# Patient Record
Sex: Male | Born: 1980 | Race: White | Hispanic: No | Marital: Single | Smoking: Former smoker
Health system: Southern US, Community
[De-identification: ages and names within clinical notes are randomized; demographics above are authoritative.]

## PROBLEM LIST (undated history)

## (undated) DIAGNOSIS — F32A Depression, unspecified: Secondary | ICD-10-CM

## (undated) DIAGNOSIS — Z8619 Personal history of other infectious and parasitic diseases: Secondary | ICD-10-CM

## (undated) HISTORY — DX: Personal history of other infectious and parasitic diseases: Z86.19

## (undated) HISTORY — DX: Depression, unspecified: F32.A

---

## 2013-08-01 HISTORY — PX: OTHER SURGICAL HISTORY: SHX169

## 2020-04-21 ENCOUNTER — Telehealth: Payer: Self-pay | Admitting: *Deleted

## 2020-04-21 ENCOUNTER — Encounter: Payer: Self-pay | Admitting: *Deleted

## 2020-04-21 NOTE — Telephone Encounter (Signed)
Noted if new symptoms consistent with covid later this week would recommend testing but if resolved no further action required at this time.

## 2020-04-21 NOTE — Telephone Encounter (Signed)
HR asked RN to contact pt for Covid r/o as he called out of work today and yesterday citing stomach issues.  Pt reports Hx of weight loss surgery 6 years ago. Intermittently he can still have sx of dumping syndrome when he eats something outside of his normal diet. He believes sx are r/t dairy (ice cream) that he had on Sunday. Sx improving today and he plans to RTW tomorrow. Denies any other sx such as body aches, fever/chills, URI sx. HR made aware that sx are not concerning for Covid; personal medical related only.

## 2020-06-10 ENCOUNTER — Telehealth: Payer: Self-pay | Admitting: Registered Nurse

## 2020-06-10 ENCOUNTER — Encounter: Payer: Self-pay | Admitting: Registered Nurse

## 2020-06-10 NOTE — Telephone Encounter (Signed)
HR asked clinic staff to contact pt for Covid r/o as he called out of work today and yesterday citing stomach issues.  Pt reports Hx of bariatric surgery-gastric sleeve 2015 in [place], [place]. Intermittently he can still have sx of dumping syndrome when he eats something outside of his normal diet. He believes sx are r/t dairy that he had a couple days ago.  He has been eating oatmeal and restricted bland diet and symptoms have been improving plans to RTW tomorrow.  HR notified no covid testing recommended and patient cleared to RTW 06/11/2020.  Sx improving today and he plans to RTW tomorrow. Denies any other sx such as body aches, fever/chills, URI sx. HR made aware that sx are not concerning for Covid; personal medical related only.   Patient instructed to notify clinic staff if new symptoms develop for re-evaluation.  Discussed with patient [employer] can not write work excuse/FMLA paperwork and he would need to contact his PCM.  Patient verbalized understanding information/instructions, agreed with plan of care and had no further questions at this time.

## 2020-06-11 NOTE — Telephone Encounter (Signed)
noted 

## 2020-06-11 NOTE — Telephone Encounter (Signed)
Noted. Personal medical. Not Covid related, no quarantine indicated. Pt plans to RTW tomorrow. Of note, RN spoke with pt about a month ago after he had similar sx. At that time, RN discussed intermittent FMLA and provided a list of local primary care offices so pt could work on establishing care with primary.

## 2020-06-13 NOTE — Telephone Encounter (Signed)
Please verify patient returned to work as expected 

## 2020-06-15 NOTE — Telephone Encounter (Signed)
Noted RTW as expected. 

## 2020-06-15 NOTE — Telephone Encounter (Signed)
Per pt's supervisor, pt RTW 06/11/20 as expected.

## 2020-06-16 ENCOUNTER — Telehealth: Payer: Self-pay | Admitting: Registered Nurse

## 2020-06-16 ENCOUNTER — Encounter: Payer: Self-pay | Admitting: Registered Nurse

## 2020-06-16 NOTE — Telephone Encounter (Signed)
Patient asking if xanax can be refilled in clinic as ran out a few weeks ago and trying to establish care with new PCM appt scheduled December.  Discussed with patient contract limitations prohibit me from prescribing mental health medications/controlled medications in this clinic.  Doreesa does counseling telehealth and RN Rolly Salter will send him her contact information via email.  Discussed [facility] now and was given central appts telephone number.  Patient also stated he was told of resident clinic at [facility] that might be able to see him sooner than PCM and he is going to call that number also.  Patient in no acute distress well groomed normal speech/behavior respirations even and unlabored 16 gait sure and steady in clinic reported new job going well and he is enjoying working at [employer].  Patient verbalized understanding of information/instructions agreed with plan of care and had no further questions at this time.

## 2020-06-17 ENCOUNTER — Encounter: Payer: Self-pay | Admitting: Internal Medicine

## 2020-06-22 NOTE — Telephone Encounter (Signed)
Noted patient was given EAP provider information and had community clinic information to schedule appts.

## 2020-06-22 NOTE — Telephone Encounter (Signed)
RN confirmed pt's personal email address over phone with pt and sent counselor Dorisa's contact information.

## 2020-07-13 ENCOUNTER — Telehealth: Payer: Self-pay | Admitting: *Deleted

## 2020-07-13 ENCOUNTER — Encounter: Payer: Self-pay | Admitting: *Deleted

## 2020-07-13 NOTE — Telephone Encounter (Signed)
Pt received booster on late Thursday 07/09/20. Called out of work on 12/10 due to fatigue and body aches. Sx have now continued through today but also developed a cough on Saturday. Reports chills, has not checked temp. Due to more than 72 hours of sx after booster, and cough which is not typical booster side effect, scheduled pt for testing.  Day 1 of Sx: 07/10/20 Day 1 of quarantine: 07/10/20 (woke up with sx 0400) Testing between sx days 3-5 Test scheduled 07/13/20 at 1500 at [facility] Day 7 quarantine: 07/16/20 RTW date: 07/17/20  NP virtual appt made for tomorrow 12/14.HR made aware of dates above.

## 2020-07-13 NOTE — Telephone Encounter (Signed)
Noted agree with quarantine and testing plan

## 2020-07-14 NOTE — Telephone Encounter (Signed)
Reviewed RN Rolly Salter note covid test results pending symptoms improving continue quarantine

## 2020-07-14 NOTE — Telephone Encounter (Signed)
Spoke with pt by phone. He reports feeling better today. Fatigue and body aches have resolved. Reports infrequent cough still, none noted during 7 minute call. Covid test completed yesterday. Continuing quarantine. Will f/u tomorrow or 12/16 for anticipated results.

## 2020-07-15 NOTE — Telephone Encounter (Signed)
No answer left message to call clinic when test results received or email me at [email or web address]

## 2020-07-16 NOTE — Telephone Encounter (Signed)
Patient left message covid test results negative sent screen shot.  RTW date 07/17/2020 as long as symptoms improving, no new symptoms and no vomiting/diarrhea/fever or chills on 07/16/2020 message replied and sent to HR.

## 2020-07-17 NOTE — Telephone Encounter (Signed)
Confirmed with pt that he did RTW today and denies current sx. No further needs. Closing encounter.

## 2020-07-18 NOTE — Telephone Encounter (Signed)
Noted patient RTW as expected no further questions or concerns.

## 2020-07-29 ENCOUNTER — Telehealth: Payer: Self-pay | Admitting: Registered Nurse

## 2020-07-29 ENCOUNTER — Encounter: Payer: Self-pay | Admitting: Registered Nurse

## 2020-07-29 NOTE — Telephone Encounter (Signed)
HR manager Lincoln Maxin notified NP that pt reported exposed on 07/25/2020 to a confirmed positive covid person. Asymptomatic.  Covid vaccinated per HR.  Attempted to reach patient via telephone no answer left message.  Discussed testing needs to be scheduled day 7 after exposure, monitor for symptoms x 14 days wear mask at work and not able to eat in group lunch room but in car, outside or another location away from others recommended e.g. home.  Patient can email me [email or web address] when clinic closed (Wed, Sat, Sun) or call RN Rolly Salter [phone] M, Tu, Th, Fr during clinic hours to discuss further.  Need vaccination dates and if greater than 6 months since last dose/no booster recommend scheduling booster.

## 2020-07-30 NOTE — Telephone Encounter (Addendum)
Spoke with pt by phone. He confirms all information recorded by NP as correct. He reports remaining asymptomatic currently. Discussed eating in car or outside and he reports he already eats lunch in his car. Reviewed mask use for next 14 days.  Testing set up for Monday 08/04/19 at 1430 at [facility]. Immunization and booster dates entered into [facility].

## 2020-07-30 NOTE — Telephone Encounter (Signed)
Reviewed RN Haley note agree with plan of care. 

## 2020-08-06 NOTE — Telephone Encounter (Signed)
Patient reported covid test results negative continue to monitor for covid symptoms and wearing mask.  Strict mask use ends today and may use company lunch room starting tomorrow 1/7 as long no new symptoms or known covid exposures.  Patient fully vaccinated and boosted. Patient notified CDC updated covid guidelines twice in the past 10 days. Patient notified covid and flu cases on the rise in local community wear mask when around others and socially distance as covid test rate positives near 30% this week.

## 2020-08-08 NOTE — Telephone Encounter (Signed)
Patient returned call doing well, no problems at work and no symptoms at this time or concerns.  tcon closed.  A&Ox3 respirations even and unlabored spoke full sentences without difficulty no cough/nasal congestion/sniffing or throat clearing noted.

## 2020-08-08 NOTE — Telephone Encounter (Signed)
Left message checking in to ensure remained asymptomatic and no difficulty with working to close tcon as monitoring period has ended.

## 2020-08-24 ENCOUNTER — Ambulatory Visit: Payer: Self-pay | Admitting: Family Medicine

## 2020-08-25 ENCOUNTER — Ambulatory Visit: Payer: Self-pay | Admitting: Family Medicine

## 2020-09-01 ENCOUNTER — Telehealth: Payer: Self-pay | Admitting: *Deleted

## 2020-09-01 NOTE — Telephone Encounter (Signed)
RN notified by HR that pt left work early today reporting body aches and fatigue. Spoke with pt by phone. He reports HA, exhaustion, rhinorrhea.   Day 0 09/01/20 Day 5 09/06/20 RTW 09/07/20 (next scheduled workday 2/8) with strict mask use thru Friday 2/11.  Testing set up for Covid and flu testing at [facility] 09/03/20 at 1010. 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 Reviewed Dayquil/Nyquil, nasal saline spray and hydration for home management of sx. Pt denies further questions/concerns.

## 2020-09-03 ENCOUNTER — Encounter: Payer: Self-pay | Admitting: *Deleted

## 2020-09-03 ENCOUNTER — Ambulatory Visit: Payer: Self-pay | Admitting: Family Medicine

## 2020-09-03 NOTE — Telephone Encounter (Signed)
Reviewed RN Haley note agreed with plan of care. 

## 2020-09-04 NOTE — Telephone Encounter (Signed)
Attempted to contact pt for f/u. No answer. LVMRCB.

## 2020-09-05 NOTE — Telephone Encounter (Signed)
Patient contacted via telephone reported flu and covid tests both negative and that he was surprised.  Feeling better and tired of being in quarantine/bed.  Tolerating po intake without difficulty and reported normal urination and stooling daily now.  Denied fever/chills/difficulty breathing.  Discussed may return to work next scheduled shift 2/8 as long as no vomiting/diarrhea/fever/chills 24 hours prior to shift.  Patient spoke full sentences without difficulty; no nasal congestion/throat clearing noted.  Respirations even and unlabored.  Discussed disinfecting high touch surfaces daily and that other viruses are circulating in community e.g. rhinovirus/metapneumovirus, atypical flu.  Reinforced strict mask wear through 09/11/2020  Patient verbalized understanding information/instructions, agreed with plan of care and had no further questions at this time.  HR notified cleared to RTW 09/08/20.

## 2020-09-07 NOTE — Telephone Encounter (Signed)
Spoke with pt by phone. They are still ready to RTW tomorrow 2/8 as planned. Denies any sx or concerns. Strict mask use thru 2/11.

## 2020-09-08 NOTE — Telephone Encounter (Signed)
Reviewed RN Rolly Salter note patient plans to return onsite today 09/08/20

## 2020-09-08 NOTE — Telephone Encounter (Signed)
Spoke with pt by phone. Confirmed they did RTW today as planned. Strict mask use thru 2/11. Closing encounter.

## 2020-09-09 NOTE — Telephone Encounter (Signed)
Noted patient returned to work; reviewed [employer] note.

## 2020-09-16 ENCOUNTER — Other Ambulatory Visit: Payer: Self-pay

## 2020-09-17 ENCOUNTER — Ambulatory Visit: Payer: No Typology Code available for payment source | Admitting: Family Medicine

## 2020-09-17 ENCOUNTER — Encounter: Payer: Self-pay | Admitting: Family Medicine

## 2020-09-17 ENCOUNTER — Other Ambulatory Visit: Payer: Self-pay

## 2020-09-17 VITALS — BP 120/80 | HR 64 | Temp 98.2°F | Ht 69.0 in | Wt 269.0 lb

## 2020-09-17 DIAGNOSIS — F410 Panic disorder [episodic paroxysmal anxiety] without agoraphobia: Secondary | ICD-10-CM

## 2020-09-17 DIAGNOSIS — F321 Major depressive disorder, single episode, moderate: Secondary | ICD-10-CM | POA: Diagnosis not present

## 2020-09-17 DIAGNOSIS — G8929 Other chronic pain: Secondary | ICD-10-CM

## 2020-09-17 DIAGNOSIS — F411 Generalized anxiety disorder: Secondary | ICD-10-CM | POA: Insufficient documentation

## 2020-09-17 DIAGNOSIS — F988 Other specified behavioral and emotional disorders with onset usually occurring in childhood and adolescence: Secondary | ICD-10-CM | POA: Insufficient documentation

## 2020-09-17 DIAGNOSIS — M5441 Lumbago with sciatica, right side: Secondary | ICD-10-CM

## 2020-09-17 DIAGNOSIS — R41840 Attention and concentration deficit: Secondary | ICD-10-CM

## 2020-09-17 DIAGNOSIS — E669 Obesity, unspecified: Secondary | ICD-10-CM

## 2020-09-17 DIAGNOSIS — E66812 Obesity, class 2: Secondary | ICD-10-CM

## 2020-09-17 DIAGNOSIS — Z23 Encounter for immunization: Secondary | ICD-10-CM | POA: Diagnosis not present

## 2020-09-17 DIAGNOSIS — M5442 Lumbago with sciatica, left side: Secondary | ICD-10-CM

## 2020-09-17 DIAGNOSIS — Z113 Encounter for screening for infections with a predominantly sexual mode of transmission: Secondary | ICD-10-CM

## 2020-09-17 DIAGNOSIS — Z9884 Bariatric surgery status: Secondary | ICD-10-CM

## 2020-09-17 NOTE — Patient Instructions (Addendum)
We will set up ADD testing.  Continue current meds.  Keep working on healthy eating and regular exercise. Look into considering Cymbalta as treatment for anxiety as well as chronic back pain.

## 2020-09-17 NOTE — Progress Notes (Signed)
Patient ID: Coran Dipaola, male    DOB: 16-Oct-1980, 40 y.o.   MRN: [id number]  This visit was conducted in person.  BP 120/80   Pulse 64   Temp 98.2 F (36.8 C) (Temporal)   Ht 5\' 9"  (1.753 m)   Wt 269 lb (122 kg)   SpO2 98%   BMI 39.72 kg/m    CC:  Chief Complaint  Patient presents with  . Establish Care    Subjective:   HPI: Jaxan Michel is a 40 y.o. male presenting on 09/17/2020 for Establish Care  He just moved here from 09/19/2020 last 01/2020.   MDD, moderate.. Seeing psychiatrist and counselor.  Was on prozac... wean off  last year.   Mood significantly improved with  Then moved, S/P divorce.   Situation anxiety ( worse if out in public), panic attacks:  Using alprazolam 1/2 tab 2 times daily. Previous psychiatrist recommended him getting an ADD screen.   BMI 39 S/P gastric sleeve  Working on health eating, very active job at 03/2020.  PDMP reviewed during this encounter.      Relevant past medical, surgical, family and social history reviewed and updated as indicated. Interim medical history since our last visit reviewed. Allergies and medications reviewed and updated. Outpatient Medications Prior to Visit  Medication Sig Dispense Refill  . ALPRAZolam (XANAX) 0.5 MG tablet Take 0.5 mg by mouth 2 (two) times daily as needed.    . baclofen (LIORESAL) 20 MG tablet Take 20 mg by mouth every 6 (six) hours as needed.    [employer] tiZANidine (ZANAFLEX) 2 MG tablet Take 2-4 mg by mouth at bedtime.    . traZODone (DESYREL) 50 MG tablet TAKE 2 TABLETS EVERY NIGHT AS NEEDED FOR INSOMNIA     No facility-administered medications prior to visit.     Per HPI unless specifically indicated in ROS section below Review of Systems  Constitutional: Negative for fatigue and fever.  HENT: Negative for ear pain.   Eyes: Negative for pain.  Respiratory: Negative for cough and shortness of breath.   Cardiovascular: Negative for chest pain, palpitations and leg swelling.   Gastrointestinal: Negative for abdominal pain.  Genitourinary: Negative for dysuria.  Musculoskeletal: Negative for arthralgias.  Neurological: Negative for syncope, light-headedness and headaches.  Psychiatric/Behavioral: Negative for dysphoric mood.   Objective:  BP 120/80   Pulse 64   Temp 98.2 F (36.8 C) (Temporal)   Ht 5\' 9"  (1.753 m)   Wt 269 lb (122 kg)   SpO2 98%   BMI 39.72 kg/m   Wt Readings from Last 3 Encounters:  09/17/20 269 lb (122 kg)      Physical Exam Constitutional:      General: He is not in acute distress.    Appearance: Normal appearance. He is well-developed. He is not ill-appearing or toxic-appearing.  HENT:     Head: Normocephalic and atraumatic.     Right Ear: Hearing, tympanic membrane, ear canal and external ear normal.     Left Ear: Hearing, tympanic membrane, ear canal and external ear normal.     Nose: Nose normal.     Mouth/Throat:     Pharynx: Uvula midline.  Eyes:     General: Lids are normal. Lids are everted, no foreign bodies appreciated.     Conjunctiva/sclera: Conjunctivae normal.     Pupils: Pupils are equal, round, and reactive to light.  Neck:     Thyroid: No thyroid mass or thyromegaly.     Vascular:  No carotid bruit.     Trachea: Trachea and phonation normal.  Cardiovascular:     Rate and Rhythm: Normal rate and regular rhythm.     Pulses: Normal pulses.     Heart sounds: S1 normal and S2 normal. No murmur heard. No gallop.   Pulmonary:     Breath sounds: Normal breath sounds. No wheezing, rhonchi or rales.  Abdominal:     General: Bowel sounds are normal.     Palpations: Abdomen is soft.     Tenderness: There is no abdominal tenderness. There is no guarding or rebound.     Hernia: No hernia is present.  Musculoskeletal:     Cervical back: Normal range of motion and neck supple.  Lymphadenopathy:     Cervical: No cervical adenopathy.  Skin:    General: Skin is warm and dry.     Findings: No rash.  Neurological:      Mental Status: He is alert.     Cranial Nerves: No cranial nerve deficit.     Sensory: No sensory deficit.     Gait: Gait normal.     Deep Tendon Reflexes: Reflexes are normal and symmetric.  Psychiatric:        Speech: Speech normal.        Behavior: Behavior normal.        Judgment: Judgment normal.       No results found for this or any previous visit.  This visit occurred during the SARS-CoV-2 public health emergency.  Safety protocols were in place, including screening questions prior to the visit, additional usage of staff PPE, and extensive cleaning of exam room while observing appropriate contact time as indicated for disinfecting solutions.   COVID 19 screen:  No recent travel or known exposure to COVID19 The patient denies respiratory symptoms of COVID 19 at this time. The importance of social distancing was discussed today.   Assessment and Plan    Problem List Items Addressed This Visit    Attention and concentration deficit    Referred for  ADD evaluation.      Chronic bilateral low back pain with bilateral sciatica     Stable control on tizanidine  Or baclofen.  Do home PT.      Relevant Medications   traZODone (DESYREL) 50 MG tablet   MDD (major depressive disorder), single episode, moderate (HCC)    Previously seeing psychiatrist and counselor.  Look into considering Cymbalta as treatment for anxiety as well as chronic back pain.      Relevant Medications   traZODone (DESYREL) 50 MG tablet   Obesity, Class II, BMI 35-39.9, no comorbidity    Encouraged exercise, weight loss, healthy eating habits.       Relevant Orders   Lipid panel   Comprehensive metabolic panel   Panic attacks    Using alprazolam 1/2 tab 2 times daily.      Relevant Medications   traZODone (DESYREL) 50 MG tablet    Other Visit Diagnoses    Need for Tdap vaccination    -  Primary   Relevant Orders   Tdap vaccine greater than or equal to 7yo IM (Completed)   Screen  for STD (sexually transmitted disease)       Relevant Orders   Hepatitis panel, acute   HIV Antibody (routine testing w rflx)   C. trachomatis/N. gonorrhoeae RNA   RPR   History of gastric restrictive surgery       Relevant Orders   Vitamin  B12   VITAMIN D 25 Hydroxy (Vit-D Deficiency, Fractures)   CBC with Differential/Platelet   IBC + Ferritin       Kerby Nora, MD

## 2020-09-17 NOTE — Progress Notes (Signed)
[id number]  

## 2020-09-17 NOTE — Assessment & Plan Note (Addendum)
Stable control on tizanidine  Or baclofen.  Do home PT.

## 2020-10-22 ENCOUNTER — Telehealth: Payer: Self-pay | Admitting: *Deleted

## 2020-10-22 ENCOUNTER — Encounter: Payer: Self-pay | Admitting: *Deleted

## 2020-10-22 MED ORDER — ONDANSETRON 4 MG PO TBDP: 4.0000 mg | ORAL_TABLET | Freq: Three times a day (TID) | ORAL | 0 refills | Status: DC | PRN

## 2020-10-22 NOTE — Telephone Encounter (Signed)
Reviewed RN Rolly Salter note and awaiting patient call back to clinic staff for evaluation.

## 2020-10-22 NOTE — Telephone Encounter (Signed)
Reviewed RN Rolly Salter note agree with plan of care and electronic Rx for zofran signed and sent to patient pharmacy of choice.  Will follow up with patient tomorrow for re-evaluation.

## 2020-10-22 NOTE — Telephone Encounter (Signed)
Pt returned call. He reports nausea and fatigue have worsened. Able to tolerate small sips of fluid, but no solids. HA persists. Nasal congestion, runny nose, PND with throat clearing. Denies cough or chest congestion.  Booster received 07/09/20.   Rx sent to pharmacy of choice for Zofran for nausea.   Day 0 3/23 Day 5 3/28 RTW 3/29 with strict mask use thru 4/2.  Flu and Covid testing scheduled for Fri 3/25 at 5:00 at [facility].

## 2020-10-22 NOTE — Telephone Encounter (Signed)
Pt sent email to [employer] yesterday 3/23 reporting being at work for about an hour and a half before developing n/v. He left and went home and rested. Later in the day he developed body aches, runny nose, and a severe headache. As of his email at 2318 last night, he was tolerating liquids po but not solids. Lives with a roommate and already notified roommate of sx and is wearing a mask when he must be out in the common areas and is otherwise isolating in his own room.   Attempted to contact pt. No answer. LVMRCB.

## 2020-10-26 NOTE — Telephone Encounter (Signed)
Spoke with pt by phone. He reports nausea resolved Friday night after starting Zofran. Only 3 doses needed. All other sx resolved Saturday. Back to regular diet today. Both flu and Covid tests back as negative yesterday. Advised cleared to RTW tomorrow 3/29 as planned with strict mask use thru 4/2. Denies questions or concerns.

## 2020-10-26 NOTE — Telephone Encounter (Signed)
Reviewed RN Rolly Salter noted negative test results agreed with plan of care for RTW 3/29.

## 2020-11-01 NOTE — Telephone Encounter (Signed)
Patient contacted via telephone stated feeling well but is not planning to return to work onsite as last day of work 27 Oct 2020.  No longer eligible for care at [facility] clinic.  Notified patient I would tell RN Rolly Salter he is no long employee of [employer] also.  Patient verbalized understanding information/instructions, agreed with plan of care and had no further questions at this time.

## 2020-11-03 ENCOUNTER — Telehealth: Payer: Self-pay

## 2020-11-03 MED ORDER — DULOXETINE HCL 30 MG PO CPEP
30.0000 mg | ORAL_CAPSULE | Freq: Every day | ORAL | 3 refills | Status: DC
Start: 2020-11-03 — End: 2020-11-27

## 2020-11-03 NOTE — Telephone Encounter (Signed)
Patient called and stated that he saw Dr. Ermalene Searing in February and she suggested that he switch to Cymbalta to help with anxiety and back pain. Patient stated that he is ready to start Cymbalta. Patient stated that he recently lost his job, so he does not currently have insurance, but is willing to pay OOP for the prescription. [facility] [place] Patient stated that he cannot be seen or have any other test until he gets health insurance.

## 2020-11-03 NOTE — Addendum Note (Signed)
Addended by: Kerby Nora E on: 11/03/2020 05:41 PM   Modules accepted: Orders

## 2020-11-03 NOTE — Telephone Encounter (Signed)
If he does not view MyChart message sent... Call Sent in Rx for Cymbalta 30 mg daily, takes 3-4 weeks to improve... Have him send me a MyChart message with update in 2 weeks.. if no side effects at that time we can plan to increase dose to 60 mg daily.

## 2020-11-04 NOTE — Telephone Encounter (Signed)
Randall Barnett notified as instructed by telephone.  Patient states understanding.  (I couldn't find a MyChart message).

## 2020-11-25 ENCOUNTER — Telehealth: Payer: Self-pay | Admitting: *Deleted

## 2020-11-25 NOTE — Assessment & Plan Note (Addendum)
Previously seeing psychiatrist and counselor.  Look into considering Cymbalta as treatment for anxiety as well as chronic back pain.

## 2020-11-25 NOTE — Telephone Encounter (Signed)
-----   Message from Excell Seltzer, MD sent at 11/25/2020 10:06 AM EDT -----  Call this pt for an update on mood after starting Cymbalta and also if he still wants a referral to ADD ( this was discussed but I do not see that it was ordered)

## 2020-11-25 NOTE — Assessment & Plan Note (Signed)
Using alprazolam 1/2 tab 2 times daily.

## 2020-11-25 NOTE — Assessment & Plan Note (Signed)
Encouraged exercise, weight loss, healthy eating habits. ? ?

## 2020-11-25 NOTE — Telephone Encounter (Signed)
Left message for Randall Barnett to return my call.

## 2020-11-25 NOTE — Progress Notes (Signed)
See Phone Note. 

## 2020-11-25 NOTE — Assessment & Plan Note (Addendum)
Referred for  ADD evaluation.

## 2020-11-26 ENCOUNTER — Other Ambulatory Visit: Payer: Self-pay | Admitting: Family Medicine

## 2020-11-26 NOTE — Telephone Encounter (Signed)
Left message for Jayshawn to return my call. 

## 2020-11-27 MED ORDER — DULOXETINE HCL 60 MG PO CPEP: 60.0000 mg | ORAL_CAPSULE | Freq: Every day | ORAL | 0 refills | Status: DC

## 2020-11-27 NOTE — Telephone Encounter (Signed)
Do not recommend alprazolam as long term med.. recommend trial of higher dose Cymbalta if he is tolerating it.  Schedule follow up in 4 weeks after increase if he is willing. Let me know and I will send in higher dose prescription.

## 2020-11-27 NOTE — Telephone Encounter (Addendum)
Ric notified as instructed by telephone. He is agreeable in increasing Cymbalta dose.  Please send new Rx to [facility] on [address]. [place] would like a 90 day supply.  Follow up appointment scheduled 12/25/2020 at 4:00 pm.

## 2020-11-27 NOTE — Telephone Encounter (Signed)
Pt called and stated that he is not working and cant do the referral and with the Cymbalta he hasnt seen any changes , hes still having aniexty. And wanted to know about getting Xanex 0.5.

## 2020-11-27 NOTE — Telephone Encounter (Signed)
Rx for cymbalta 60 mg daily sent in

## 2020-12-25 ENCOUNTER — Ambulatory Visit: Payer: No Typology Code available for payment source | Admitting: Family Medicine

## 2021-01-19 NOTE — Telephone Encounter (Signed)
Last office visit 09/17/2020 for Establish Care.  Meds are listed as historical.  Ok to refill?

## 2021-01-21 NOTE — Telephone Encounter (Signed)
He needs the refill to [facility] to [facility] [place]. And he also wanted to know if he can get the 4mg  instead of 2mg  that way he can take 1 pill instead of 2

## 2021-01-25 MED ORDER — TIZANIDINE HCL 4 MG PO TABS: 4.0000 mg | ORAL_TABLET | Freq: Every day | ORAL | 0 refills | Status: DC

## 2021-02-22 ENCOUNTER — Other Ambulatory Visit: Payer: Self-pay | Admitting: Family Medicine

## 2021-04-20 ENCOUNTER — Other Ambulatory Visit: Payer: Self-pay

## 2021-04-20 ENCOUNTER — Emergency Department
Admission: EM | Admit: 2021-04-20 | Discharge: 2021-04-21 | Disposition: A | Discharge status: Home / Self Care | Payer: No Typology Code available for payment source | Attending: Emergency Medicine | Admitting: Emergency Medicine

## 2021-04-20 ENCOUNTER — Encounter: Payer: Self-pay | Admitting: Emergency Medicine

## 2021-04-20 DIAGNOSIS — Z5321 Procedure and treatment not carried out due to patient leaving prior to being seen by health care provider: Secondary | ICD-10-CM | POA: Insufficient documentation

## 2021-04-20 DIAGNOSIS — R519 Headache, unspecified: Secondary | ICD-10-CM | POA: Diagnosis present

## 2021-04-20 MED ORDER — TIZANIDINE HCL 4 MG PO TABS: 4.0000 mg | ORAL_TABLET | Freq: Every day | ORAL | 0 refills | Status: DC

## 2021-04-20 NOTE — Telephone Encounter (Signed)
Refill request Tizanidine Last refill 01/25/21 #90 Last office visit 09/17/20 Upcoming appointment 04/23/21

## 2021-04-20 NOTE — ED Triage Notes (Signed)
C/O headache.  AAOx3. Skin warm and dry. NAD

## 2021-04-23 ENCOUNTER — Other Ambulatory Visit: Payer: Self-pay

## 2021-04-23 ENCOUNTER — Ambulatory Visit (INDEPENDENT_AMBULATORY_CARE_PROVIDER_SITE_OTHER): Payer: No Typology Code available for payment source | Admitting: Family Medicine

## 2021-04-23 ENCOUNTER — Ambulatory Visit: Payer: No Typology Code available for payment source | Admitting: Family Medicine

## 2021-04-23 VITALS — BP 140/80 | HR 55 | Temp 98.2°F | Ht 69.0 in | Wt 263.2 lb

## 2021-04-23 DIAGNOSIS — E669 Obesity, unspecified: Secondary | ICD-10-CM | POA: Diagnosis not present

## 2021-04-23 DIAGNOSIS — Z13220 Encounter for screening for lipoid disorders: Secondary | ICD-10-CM | POA: Diagnosis not present

## 2021-04-23 DIAGNOSIS — R519 Headache, unspecified: Secondary | ICD-10-CM | POA: Diagnosis not present

## 2021-04-23 DIAGNOSIS — Z23 Encounter for immunization: Secondary | ICD-10-CM | POA: Diagnosis not present

## 2021-04-23 DIAGNOSIS — Z9884 Bariatric surgery status: Secondary | ICD-10-CM | POA: Diagnosis not present

## 2021-04-23 DIAGNOSIS — Z113 Encounter for screening for infections with a predominantly sexual mode of transmission: Secondary | ICD-10-CM

## 2021-04-23 DIAGNOSIS — E66812 Obesity, class 2: Secondary | ICD-10-CM

## 2021-04-23 NOTE — Patient Instructions (Addendum)
Check blood pressure at home. Few days a week.  Goal BP < 140/90. Call if elevated. Keep headache diary if returns. Work on healthy lifestyle.   Look into Wegovy or possibly  Trulicity, or Ozempic coverage and availability.

## 2021-04-23 NOTE — Assessment & Plan Note (Signed)
Now resolved.  Possible new dx of migraine vs viral related headache vs BP related headache.  Increase water,healthy lifestyle,  avoid migraine triggers, follow BP at home.  If recurs.Marland Kitchen keep migraine diary.   Normal neurologic exam. Cleared to return to work.

## 2021-04-23 NOTE — Progress Notes (Signed)
Patient ID: Ndrew Creason, male    DOB: 1980/11/25, 40 y.o.   MRN: [id number]  This visit was conducted in person.  BP 140/80   Pulse (!) 55   Temp 98.2 F (36.8 C) (Temporal)   Ht 5\' 9"  (1.753 m)   Wt 263 lb 4 oz (119.4 kg)   SpO2 98%   BMI 38.88 kg/m    CC: Chief Complaint  Patient presents with   Follow-up    ED Visit-Migraine    Subjective:   HPI: Macarius Ruark is a 40 y.o. male presenting on 04/23/2021 for Follow-up (ED Visit-Migraine)  40 year old male with history of MDD, GAD presents for ER follow up for migraine. Reviewed ER note in detail.  Seen 04/20/21, but he left before he was seen.  BP was 143/96   He reports he was  at work last week... woke with crick in neck.. resulted in pain in neck, emesis. Photophobia.  Pain continue to worsen.  Dx at urgent care with a migraine. Given toradol, zofran.  Given eletriptan.  Did not improve... had COVID test and flu test negative.  Sent to ER for meningitis rule out.  No fever.  Left before being seen.  He has no further headache in  last 3 days.   He has no history of migraine.   No stress, no mood changes. Good po intake.  No known triggers.   Wt Readings from Last 3 Encounters:  04/23/21 263 lb 4 oz (119.4 kg)  04/20/21 268 lb 15.4 oz (122 kg)  09/17/20 269 lb (122 kg)    BP Readings from Last 3 Encounters:  04/23/21 140/80  04/20/21 (!) 143/96  09/17/20 120/80     Relevant past medical, surgical, family and social history reviewed and updated as indicated. Interim medical history since our last visit reviewed. Allergies and medications reviewed and updated. Outpatient Medications Prior to Visit  Medication Sig Dispense Refill   ALPRAZolam (XANAX) 0.5 MG tablet Take 0.5 mg by mouth 2 (two) times daily as needed.     DULoxetine (CYMBALTA) 60 MG capsule TAKE ONE CAPSULE BY MOUTH ONE TIME DAILY 90 capsule 0   ondansetron (ZOFRAN ODT) 4 MG disintegrating tablet Take 1 tablet (4 mg total) by  mouth every 8 (eight) hours as needed for nausea or vomiting. 20 tablet 0   tiZANidine (ZANAFLEX) 4 MG tablet Take 1 tablet (4 mg total) by mouth at bedtime. 90 tablet 0   traZODone (DESYREL) 50 MG tablet TAKE 2 TABLETS EVERY NIGHT AS NEEDED FOR INSOMNIA     No facility-administered medications prior to visit.     Per HPI unless specifically indicated in ROS section below Review of Systems  Constitutional:  Negative for fatigue and fever.  HENT:  Negative for ear pain.   Eyes:  Negative for pain.  Respiratory:  Negative for cough and shortness of breath.   Cardiovascular:  Negative for chest pain, palpitations and leg swelling.  Gastrointestinal:  Negative for abdominal pain.  Genitourinary:  Negative for dysuria.  Musculoskeletal:  Negative for arthralgias.  Neurological:  Negative for syncope, light-headedness and headaches.  Psychiatric/Behavioral:  Negative for dysphoric mood.   Objective:  BP 140/80   Pulse (!) 55   Temp 98.2 F (36.8 C) (Temporal)   Ht 5\' 9"  (1.753 m)   Wt 263 lb 4 oz (119.4 kg)   SpO2 98%   BMI 38.88 kg/m   Wt Readings from Last 3 Encounters:  04/23/21 263 lb 4  oz (119.4 kg)  04/20/21 268 lb 15.4 oz (122 kg)  09/17/20 269 lb (122 kg)      Physical Exam Constitutional:      Appearance: He is well-developed.  HENT:     Head: Normocephalic.     Right Ear: Hearing normal.     Left Ear: Hearing normal.     Nose: Nose normal.  Neck:     Thyroid: No thyroid mass or thyromegaly.     Vascular: No carotid bruit.     Trachea: Trachea normal.  Cardiovascular:     Rate and Rhythm: Normal rate and regular rhythm.     Pulses: Normal pulses.     Heart sounds: Heart sounds not distant. No murmur heard.   No friction rub. No gallop.     Comments: No peripheral edema Pulmonary:     Effort: Pulmonary effort is normal. No respiratory distress.     Breath sounds: Normal breath sounds.  Skin:    General: Skin is warm and dry.     Findings: No rash.   Neurological:     General: No focal deficit present.     Mental Status: He is alert and oriented to person, place, and time.     Cranial Nerves: Cranial nerves are intact.     Sensory: Sensation is intact.     Motor: Motor function is intact.     Coordination: Coordination is intact.  Psychiatric:        Speech: Speech normal.        Behavior: Behavior normal.        Thought Content: Thought content normal.      No results found for this or any previous visit.  This visit occurred during the SARS-CoV-2 public health emergency.  Safety protocols were in place, including screening questions prior to the visit, additional usage of staff PPE, and extensive cleaning of exam room while observing appropriate contact time as indicated for disinfecting solutions.   COVID 19 screen:  No recent travel or known exposure to COVID19 The patient denies respiratory symptoms of COVID 19 at this time. The importance of social distancing was discussed today.   Assessment and Plan Problem List Items Addressed This Visit     Acute nonintractable headache - Primary    Now resolved.  Possible new dx of migraine vs viral related headache vs BP related headache.  Increase water,healthy lifestyle,  avoid migraine triggers, follow BP at home.  If recurs.Marland Kitchen keep migraine diary.   Normal neurologic exam. Cleared to return to work.      Obesity, Class II, BMI 35-39.9, no comorbidity    Stable, chronic.  Continue current medication.    Discussed weight loss options.       Relevant Orders   Hemoglobin A1c   Other Visit Diagnoses     Screening cholesterol level       Relevant Orders   Lipid panel   Comprehensive metabolic panel   History of gastric restrictive surgery       Relevant Orders   Vitamin B12   CBC with Differential/Platelet   Iron, Total/Total Iron Binding Cap   Ferritin   Transferrin   Screen for STD (sexually transmitted disease)       Need for influenza vaccination        Relevant Orders   Flu Vaccine QUAD 6+ mos PF IM (Fluarix Quad PF) (Completed)          Kerby Nora, MD

## 2021-04-23 NOTE — Assessment & Plan Note (Signed)
Stable, chronic.  Continue current medication.    Discussed weight loss options.

## 2021-04-24 LAB — CBC WITH DIFFERENTIAL/PLATELET
Absolute Monocytes: 446 cells/uL (ref 200–950)
Basophils Absolute: 16 cells/uL (ref 0–200)
Basophils Relative: 0.2 %
Eosinophils Absolute: 8 cells/uL — ABNORMAL LOW (ref 15–500)
Eosinophils Relative: 0.1 %
HCT: 46.8 % (ref 38.5–50.0)
Hemoglobin: 15.8 g/dL (ref 13.2–17.1)
Lymphs Abs: 1539 cells/uL (ref 850–3900)
MCH: 28.1 pg (ref 27.0–33.0)
MCHC: 33.8 g/dL (ref 32.0–36.0)
MCV: 83.3 fL (ref 80.0–100.0)
MPV: 11.8 fL (ref 7.5–12.5)
Monocytes Relative: 5.5 %
Neutro Abs: 6091 cells/uL (ref 1500–7800)
Neutrophils Relative %: 75.2 %
Platelets: 288 10*3/uL (ref 140–400)
RBC: 5.62 10*6/uL (ref 4.20–5.80)
RDW: 13.1 % (ref 11.0–15.0)
Total Lymphocyte: 19 %
WBC: 8.1 10*3/uL (ref 3.8–10.8)

## 2021-04-24 LAB — FERRITIN: Ferritin: 27 ng/mL — ABNORMAL LOW (ref 38–380)

## 2021-04-24 LAB — VITAMIN B12: Vitamin B-12: 458 pg/mL (ref 200–1100)

## 2021-04-24 LAB — TRANSFERRIN: Transferrin: 282 mg/dL (ref 188–341)

## 2021-04-24 LAB — IRON, TOTAL/TOTAL IRON BINDING CAP
%SAT: 20 % (calc) (ref 20–48)
Iron: 70 ug/dL (ref 50–180)
TIBC: 348 mcg/dL (calc) (ref 250–425)

## 2021-04-26 ENCOUNTER — Ambulatory Visit (HOSPITAL_COMMUNITY): Payer: Self-pay

## 2021-04-26 ENCOUNTER — Telehealth: Payer: Self-pay | Admitting: *Deleted

## 2021-04-26 LAB — COMPREHENSIVE METABOLIC PANEL
AG Ratio: 2 (calc) (ref 1.0–2.5)
ALT: 16 U/L (ref 9–46)
AST: 15 U/L (ref 10–40)
Albumin: 5 g/dL (ref 3.6–5.1)
Alkaline phosphatase (APISO): 66 U/L (ref 36–130)
BUN: 16 mg/dL (ref 7–25)
CO2: 23 mmol/L (ref 20–32)
Calcium: 9.8 mg/dL (ref 8.6–10.3)
Chloride: 104 mmol/L (ref 98–110)
Creat: 0.93 mg/dL (ref 0.60–1.26)
Globulin: 2.5 g/dL (calc) (ref 1.9–3.7)
Glucose, Bld: 93 mg/dL (ref 65–99)
Potassium: 4.2 mmol/L (ref 3.5–5.3)
Sodium: 140 mmol/L (ref 135–146)
Total Bilirubin: 0.4 mg/dL (ref 0.2–1.2)
Total Protein: 7.5 g/dL (ref 6.1–8.1)

## 2021-04-26 LAB — LIPID PANEL
Cholesterol: 205 mg/dL — ABNORMAL HIGH (ref <200)
HDL: 59 mg/dL (ref >=40)
LDL Cholesterol (Calc): 123 mg/dL (calc) — ABNORMAL HIGH
Non-HDL Cholesterol (Calc): 146 mg/dL (calc) — ABNORMAL HIGH (ref <130)
Total CHOL/HDL Ratio: 3.5 (calc) (ref <5.0)
Triglycerides: 118 mg/dL (ref <150)

## 2021-04-26 LAB — HEPATITIS PANEL, ACUTE
Hep A IgM: NONREACTIVE
Hep B C IgM: NONREACTIVE
Hepatitis B Surface Ag: NONREACTIVE
Hepatitis C Ab: NONREACTIVE
SIGNAL TO CUT-OFF: 0 (ref <1.00)

## 2021-04-26 LAB — RPR: RPR Ser Ql: NONREACTIVE

## 2021-04-26 LAB — HEMOGLOBIN A1C
Hgb A1c MFr Bld: 4.8 % of total Hgb (ref <5.7)
Mean Plasma Glucose: 91 mg/dL
eAG (mmol/L): 5 mmol/L

## 2021-04-26 LAB — HIV ANTIBODY (ROUTINE TESTING W REFLEX): HIV 1&2 Ab, 4th Generation: NONREACTIVE

## 2021-04-26 LAB — C. TRACHOMATIS/N. GONORRHOEAE RNA
C. trachomatis RNA, TMA: NOT DETECTED
N. gonorrhoeae RNA, TMA: NOT DETECTED

## 2021-04-26 NOTE — Telephone Encounter (Signed)
It appears that patient has an appointment scheduled at [facility] today 04/26/21 at 1:00 pm. Called patient at [phone] and was advised that he talked with someone at the [facility] and was told that they would refer him to an ophthalmologist if he was seen there. Patient stated that he decided to call the Nurse line at [facility] to see if they can work him in today. Patient stated that he is waiting on a call back from the nurse at the eye center. Patient stated that his pain level is now at a level 7. Patient was advised if his pain does not improve or if he gets worse to go to the ER and he verbalized understanding.

## 2021-04-26 NOTE — Telephone Encounter (Signed)
PLEASE NOTE: All timestamps contained within this report are represented as [place] Standard Time. CONFIDENTIALTY NOTICE: This fax transmission is intended only for the addressee. It contains information that is legally privileged, confidential or otherwise protected from use or disclosure. If you are not the intended recipient, you are strictly prohibited from reviewing, disclosing, copying using or disseminating any of this information or taking any action in reliance on or regarding this information. If you have received this fax in error, please notify us immediately by telephone so that we can arrange for its return to [place]. Phone: [phone], Toll-Free: [phone], Fax: [phone] Page: 1 of 2 Call Id: [id number] [facility] Day - Client TELEPHONE ADVICE RECORD AccessNurse Patient Name: Randall Barnett Gender: Male DOB: [birth year] Age: 40 Y 6 M 7 D Return Phone Number: [phone] (Primary) Address: City/ State/ Zip: [place] [employer] [place]  [address] Client [facility] Day - Client Client Site [facility] - Day Physician Kerby Nora - MD Contact Type Call Who Is Calling Patient / Member / Family / Caregiver Call Type Triage / Clinical Relationship To Patient Self Return Phone Number [phone] (Primary) Chief Complaint VISION - sudden loss or sudden decrease (NOT blurred vision) Reason for Call Symptomatic / Request for Health Information Initial Comment Caller states she has a pt on the phone with double vision and headaches. The patient says he was seen for a migraine recently. When he closes one eye he cannot see at all. GOTO Facility Not Listed [place] in [place] [address] Translation No Nurse Assessment Nurse: Alexander Mt, RN, Nicholaus Bloom Date/Time (Eastern Time): 04/26/2021 11:41:18 AM Confirm and document reason for call. If symptomatic, describe symptoms. ---Caller states he is having double vision  and headaches. The patient says he was seen for a migraine recently last saturday at [facility]. States the vision in his right eye was blurry on Saturday and today it is double vision. Headache is 6/10. Does the patient have any new or worsening symptoms? ---Yes Will a triage be completed? ---Yes Related visit to physician within the last 2 weeks? ---Yes Does the PT have any chronic conditions? (i.e. diabetes, asthma, this includes High risk factors for pregnancy, etc.) ---No Is this a behavioral health or substance abuse call? ---No Guidelines Guideline Title Affirmed Question Affirmed Notes Nurse Date/Time Lamount Cohen Time) Vision Loss or Change Double vision Alexander Mt, RNNicholaus Bloom 04/26/2021 11:43:39 AM Disp. Time Lamount Cohen Time) Disposition Final User 04/26/2021 11:39:12 AM Send to Urgent Tamsen Snider PLEASE NOTE: All timestamps contained within this report are represented as [place] Standard Time. CONFIDENTIALTY NOTICE: This fax transmission is intended only for the addressee. It contains information that is legally privileged, confidential or otherwise protected from use or disclosure. If you are not the intended recipient, you are strictly prohibited from reviewing, disclosing, copying using or disseminating any of this information or taking any action in reliance on or regarding this information. If you have received this fax in error, please notify us immediately by telephone so that we can arrange for its return to [place]. Phone: [phone], Toll-Free: [phone], Fax: [phone] Page: 2 of 2 Call Id: [id number] 04/26/2021 11:45:29 AM Go to ED Now (or PCP triage) Yes Alexander Mt, RN, Prentiss Bells Disagree/Comply Comply Caller Understands Yes PreDisposition Go to Urgent Care/Walk-In Clinic Care Advice Given Per Guideline GO TO ED NOW (OR PCP TRIAGE): * IF NO PCP (PRIMARY CARE PROVIDER) SECOND-LEVEL TRIAGE: You need to be seen within the next hour. Go to the ED/UCC  at _____________  Hospital. Leave as soon as you can. CARE ADVICE given per Vision Loss or Change (Adult) guideline. ANOTHER ADULT SHOULD DRIVE: * It is better and safer if another adult drives instead of you. Referrals GO TO FACILITY OTHER - SPECIFY

## 2021-04-27 NOTE — Telephone Encounter (Signed)
Left message for Randall Barnett to please call the office with an update on how he is doing.

## 2021-04-27 NOTE — Telephone Encounter (Signed)
Noted. Please call for update.

## 2021-04-28 MED ORDER — TIZANIDINE HCL 4 MG PO TABS: 4.0000 mg | ORAL_TABLET | Freq: Three times a day (TID) | ORAL | 0 refills | Status: DC

## 2021-04-28 NOTE — Telephone Encounter (Signed)
Pt called stating that he has an appointment 9/29 with the ophthalmologist, and wasn't sure if he should stay on tiZANidine (ZANAFLEX) 4 MG tablet. Please advise.

## 2021-04-28 NOTE — Telephone Encounter (Signed)
Randall Barnett notified as instructed by telephone.  New Rx for Tizanidine 4 mg with tid dosing instructions sent to [facility] as instructed by Dr. Ermalene Searing.  Randall Barnett said he would update [place] after he see the eye doctor tomorrow.

## 2021-04-28 NOTE — Telephone Encounter (Signed)
Randall Barnett notified by telephone to stop the tizanidine.  Patient states he is confused by this.  He states when he called in he told the person on the phone that the tylenol and ibuprofen is not working but the tizanidine he takes at night is helping with the pain in his head so he was asking if he should also try taking it during the day as well, like every 6 hours, etc.  If so he would need a new Rx.  Please advise.

## 2021-04-29 ENCOUNTER — Other Ambulatory Visit: Payer: Self-pay | Admitting: Ophthalmology

## 2021-04-29 ENCOUNTER — Encounter: Payer: Self-pay | Admitting: Family Medicine

## 2021-04-29 DIAGNOSIS — H4921 Sixth [abducent] nerve palsy, right eye: Secondary | ICD-10-CM

## 2021-04-29 DIAGNOSIS — H4920 Sixth [abducent] nerve palsy, unspecified eye: Secondary | ICD-10-CM | POA: Insufficient documentation

## 2021-04-29 NOTE — Telephone Encounter (Signed)
Pt called to let Dr. 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 He will have an MRI done on 05/05/21 to rule out a stoke, brain aneurysm or tumor.

## 2021-05-03 NOTE — Telephone Encounter (Signed)
I spoke with pt; pt said symptoms started on 05/01/21; pt said he guesses symptoms could have started last wk and he did not notice. If pt has random sneeze or cough has pain or pressure that goes up neck and into head. If pt shakes his head or turns his head too fast pt said "brain feels tender"has a pressure feeling and fuzzy pain in head. Pt has a wet feeling in rt ear;no earache and has drippy nose clear water but not a runny nose. Pt has metallic taste at back of throat. Pts jaw is popping a lot; pt has slight trembling sensation in both arms when resting. The arms trembling started on 04/30/21;  pt can use arms and hands and can grasp a cup; pt is not having any difficulty in walking.pt said it is like a weird trembling like "slight parkinsons". Pt does not have a way to ck BP yet(pt has ordered a BP cuff). Pt said his temp has been normal.pt had neg home covid test on 05/03/21. Pt denied any covid symptoms and pts boss tested + covid on 04/30/21 but pt is never around his boss;UC & ED precautions given and pt voiced understanding.;Pt scheduled in office appt with DR Ermalene Searing on 05/04/21 at 3:40. Held the 3:00 due to this may take more than 20 ' appt. Sending note to Dr Ermalene Searing and Lupita Leash CMA. I do not know which CMA will work with Dr Ermalene Searing so I cannot teams a CMA.

## 2021-05-03 NOTE — Telephone Encounter (Signed)
Noted. Agree patient needs to be re-seen. MRI orbits pending on 05/05/2021 per opthalmology.

## 2021-05-04 ENCOUNTER — Ambulatory Visit (INDEPENDENT_AMBULATORY_CARE_PROVIDER_SITE_OTHER): Payer: No Typology Code available for payment source | Admitting: Family Medicine

## 2021-05-04 ENCOUNTER — Other Ambulatory Visit: Payer: Self-pay

## 2021-05-04 VITALS — BP 128/82 | HR 64 | Temp 97.9°F | Wt 257.2 lb

## 2021-05-04 DIAGNOSIS — H4921 Sixth [abducent] nerve palsy, right eye: Secondary | ICD-10-CM

## 2021-05-04 DIAGNOSIS — R519 Headache, unspecified: Secondary | ICD-10-CM

## 2021-05-04 NOTE — Patient Instructions (Addendum)
Please stop at the lab to have labs drawn.  Go to ER if new neuro changes or emesis and not keeping down fluids or new fever.

## 2021-05-04 NOTE — Progress Notes (Signed)
Patient ID: Randall Barnett, male    DOB: [birth year], 40 y.o.   MRN: [id number]  This visit was conducted in person.  BP 128/82   Pulse 64   Temp 97.9 F (36.6 C) (Temporal)   Wt 257 lb 4 oz (116.7 kg)   SpO2 97%   BMI 37.99 kg/m    Wt Readings from Last 3 Encounters:  05/04/21 257 lb 4 oz (116.7 kg)  04/23/21 263 lb 4 oz (119.4 kg)  04/20/21 268 lb 15.4 oz (122 kg)     CC: Chief Complaint  Patient presents with   Headache    When sneezes/coughs. Has MRI scheduled for afternoon of 05/05/21    Subjective:   HPI: Randall Barnett is a 40 y.o. male presenting on 05/04/2021 for Headache (When sneezes/coughs. Has MRI scheduled for afternoon of 05/05/21)  40 year old male with no history of headache presents with recent issues with headache.  Initially see at Urgent Care  9/17/2022for headache.. Dx with migraine.  Sent to ER on 04/20/2021 given worsening headache.. left without being seen.  Follow up here on 04/23/2021 with resolution of headache.  Nml neuro exam. Cleared to return to work.  CBC was normal. Ferritin low given post bariatric surgery changes.  Had flu shot that day as well.   On 04/23/2021 he had sudden onset of  emesis on way home from our office.   9/24.. had double vision. Dx with 6th nerve palsy.  Wearing eye patch to treat double vision... no improvement.   He is scheduled for MRI brain and orbits tomorrow   In last 4 days he has noted some clear drainage from right ear. Right nostril showing clear watery discharge. Metallic taste in throat.  He notes ache in head increasing as day goes on.. shaking head hurts. No neuro change.. no numbness, no weakness.  Has noted slight tremor in arms.  No neck pain  When sneezing on coughing pain in neck radiates to head.  No balance issue if wearing eye patch.  No fever.   COVID home test negative.   No head injury.  Wt Readings from Last 3 Encounters:  05/04/21 257 lb 4 oz (116.7 kg)  04/23/21 263 lb 4 oz  (119.4 kg)  04/20/21 268 lb 15.4 oz (122 kg)        Relevant past medical, surgical, family and social history reviewed and updated as indicated. Interim medical history since our last visit reviewed. Allergies and medications reviewed and updated. Outpatient Medications Prior to Visit  Medication Sig Dispense Refill   ALPRAZolam (XANAX) 0.5 MG tablet Take 0.5 mg by mouth 2 (two) times daily as needed.     DULoxetine (CYMBALTA) 60 MG capsule TAKE ONE CAPSULE BY MOUTH ONE TIME DAILY 90 capsule 0   ondansetron (ZOFRAN ODT) 4 MG disintegrating tablet Take 1 tablet (4 mg total) by mouth every 8 (eight) hours as needed for nausea or vomiting. 20 tablet 0   tiZANidine (ZANAFLEX) 4 MG tablet Take 1 tablet (4 mg total) by mouth 3 (three) times daily. 30 tablet 0   traZODone (DESYREL) 50 MG tablet TAKE 2 TABLETS EVERY NIGHT AS NEEDED FOR INSOMNIA     No facility-administered medications prior to visit.     Per HPI unless specifically indicated in ROS section below Review of Systems  Constitutional:  Negative for fatigue and fever.  HENT:  Negative for ear pain.   Eyes:  Negative for pain.  Respiratory:  Negative for cough and  shortness of breath.   Cardiovascular:  Negative for chest pain, palpitations and leg swelling.  Gastrointestinal:  Negative for abdominal pain.  Genitourinary:  Negative for dysuria.  Musculoskeletal:  Negative for arthralgias.  Neurological:  Negative for syncope, light-headedness and headaches.  Psychiatric/Behavioral:  Negative for dysphoric mood.   Objective:  BP 128/82   Pulse 64   Temp 97.9 F (36.6 C) (Temporal)   Wt 257 lb 4 oz (116.7 kg)   SpO2 97%   BMI 37.99 kg/m   Wt Readings from Last 3 Encounters:  05/04/21 257 lb 4 oz (116.7 kg)  04/23/21 263 lb 4 oz (119.4 kg)  04/20/21 268 lb 15.4 oz (122 kg)      Physical Exam Constitutional:      Appearance: He is well-developed.  HENT:     Head: Normocephalic.     Right Ear: Hearing normal.      Left Ear: Hearing normal.     Nose: Nose normal.  Eyes:     General: Visual field deficit present.     Extraocular Movements:     Right eye: Abnormal extraocular motion present.     Left eye: Normal extraocular motion.  Neck:     Thyroid: No thyroid mass or thyromegaly.     Vascular: No carotid bruit.     Trachea: Trachea normal.  Cardiovascular:     Rate and Rhythm: Normal rate and regular rhythm.     Pulses: Normal pulses.     Heart sounds: Heart sounds not distant. No murmur heard.   No friction rub. No gallop.     Comments: No peripheral edema Pulmonary:     Effort: Pulmonary effort is normal. No respiratory distress.     Breath sounds: Normal breath sounds.  Skin:    General: Skin is warm and dry.     Findings: No rash.  Neurological:     Mental Status: He is alert and oriented to person, place, and time.     Cranial Nerves: Cranial nerve deficit present. No dysarthria or facial asymmetry.     Sensory: Sensation is intact.     Motor: Motor function is intact. No weakness.     Coordination: Coordination is intact.     Comments: 6th nerve palsy  Psychiatric:        Speech: Speech normal.        Behavior: Behavior normal.        Thought Content: Thought content normal.      Results for orders placed or performed in visit on 04/23/21  C. trachomatis/N. gonorrhoeae RNA   Specimen: Urine  Result Value Ref Range   C. trachomatis RNA, TMA NOT DETECTED NOT DETECTED   N. gonorrhoeae RNA, TMA NOT DETECTED NOT DETECTED  Hemoglobin A1c  Result Value Ref Range   Hgb A1c MFr Bld 4.8 <5.7 % of total Hgb   Mean Plasma Glucose 91 mg/dL   eAG (mmol/L) 5.0 mmol/L  Lipid panel  Result Value Ref Range   Cholesterol 205 (H) <200 mg/dL   HDL 59 > OR = 40 mg/dL   Triglycerides 924 <268 mg/dL   LDL Cholesterol (Calc) 123 (H) mg/dL (calc)   Total CHOL/HDL Ratio 3.5 <5.0 (calc)   Non-HDL Cholesterol (Calc) 146 (H) <130 mg/dL (calc)  Comprehensive metabolic panel  Result Value Ref  Range   Glucose, Bld 93 65 - 99 mg/dL   BUN 16 7 - 25 mg/dL   Creat 3.41 9.62 - 2.29 mg/dL   BUN/Creatinine Ratio NOT  APPLICABLE 6 - 22 (calc)   Sodium 140 135 - 146 mmol/L   Potassium 4.2 3.5 - 5.3 mmol/L   Chloride 104 98 - 110 mmol/L   CO2 23 20 - 32 mmol/L   Calcium 9.8 8.6 - 10.3 mg/dL   Total Protein 7.5 6.1 - 8.1 g/dL   Albumin 5.0 3.6 - 5.1 g/dL   Globulin 2.5 1.9 - 3.7 g/dL (calc)   AG Ratio 2.0 1.0 - 2.5 (calc)   Total Bilirubin 0.4 0.2 - 1.2 mg/dL   Alkaline phosphatase (APISO) 66 36 - 130 U/L   AST 15 10 - 40 U/L   ALT 16 9 - 46 U/L  RPR  Result Value Ref Range   RPR Ser Ql NON-REACTIVE NON-REACTIVE  HIV Antibody (routine testing w rflx)  Result Value Ref Range   HIV 1&2 Ab, 4th Generation NON-REACTIVE NON-REACTIVE  Hepatitis panel, acute  Result Value Ref Range   Hep A IgM NON-REACTIVE NON-REACTIVE   Hepatitis B Surface Ag NON-REACTIVE NON-REACTIVE   Hep B C IgM NON-REACTIVE NON-REACTIVE   Hepatitis C Ab NON-REACTIVE NON-REACTIVE   SIGNAL TO CUT-OFF 0.00 <1.00  Vitamin B12  Result Value Ref Range   Vitamin B-12 458 200 - 1,100 pg/mL  CBC with Differential/Platelet  Result Value Ref Range   WBC 8.1 3.8 - 10.8 Thousand/uL   RBC 5.62 4.20 - 5.80 Million/uL   Hemoglobin 15.8 13.2 - 17.1 g/dL   HCT 70.6 23.7 - 62.8 %   MCV 83.3 80.0 - 100.0 fL   MCH 28.1 27.0 - 33.0 pg   MCHC 33.8 32.0 - 36.0 g/dL   RDW 31.5 17.6 - 16.0 %   Platelets 288 140 - 400 Thousand/uL   MPV 11.8 7.5 - 12.5 fL   Neutro Abs 6,091 1,500 - 7,800 cells/uL   Lymphs Abs 1,539 850 - 3,900 cells/uL   Absolute Monocytes 446 200 - 950 cells/uL   Eosinophils Absolute 8 (L) 15 - 500 cells/uL   Basophils Absolute 16 0 - 200 cells/uL   Neutrophils Relative % 75.2 %   Total Lymphocyte 19.0 %   Monocytes Relative 5.5 %   Eosinophils Relative 0.1 %   Basophils Relative 0.2 %  Iron, Total/Total Iron Binding Cap  Result Value Ref Range   Iron 70 50 - 180 mcg/dL   TIBC 737 106 - 269 mcg/dL (calc)    %SAT 20 20 - 48 % (calc)  Ferritin  Result Value Ref Range   Ferritin 27 (L) 38 - 380 ng/mL  Transferrin  Result Value Ref Range   Transferrin 282 188 - 341 mg/dL    This visit occurred during the SARS-CoV-2 public health emergency.  Safety protocols were in place, including screening questions prior to the visit, additional usage of staff PPE, and extensive cleaning of exam room while observing appropriate contact time as indicated for disinfecting solutions.   COVID 19 screen:  No recent travel or known exposure to COVID19 The patient denies respiratory symptoms of COVID 19 at this time. The importance of social distancing was discussed today.   Assessment and Plan    Problem List Items Addressed This Visit     Acute nonintractable headache - Primary   Relevant Orders   CBC with Differential/Platelet   Other Visit Diagnoses     6th nerve palsy, right       Relevant Orders   CBC with Differential/Platelet   Sedimentation rate   C-reactive protein   TSH  Unclear etiology.. Move forward as planned with MRI brain and orbits to look into  possible intracranial mass, vascular issue, stroke etc. No known head injury.   Concerning for increased intracranial pressure, ? CSF leak causing runny nose/ ear drainage... no nasal or ear pathology noted.   Possible viral infection.  Eval with labs as well as with sed rate and rule out new thyroid issue.    ER precautions given.  Kerby Nora, MD

## 2021-05-05 ENCOUNTER — Ambulatory Visit
Admission: RE | Admit: 2021-05-05 | Discharge: 2021-05-05 | Disposition: A | Discharge status: Home / Self Care | Payer: No Typology Code available for payment source | Source: Ambulatory Visit | Attending: Ophthalmology | Admitting: Ophthalmology

## 2021-05-05 DIAGNOSIS — H4921 Sixth [abducent] nerve palsy, right eye: Secondary | ICD-10-CM

## 2021-05-05 LAB — CBC WITH DIFFERENTIAL/PLATELET
Basophils Absolute: 0.1 10*3/uL (ref 0.0–0.1)
Basophils Relative: 0.9 % (ref 0.0–3.0)
Eosinophils Absolute: 0 10*3/uL (ref 0.0–0.7)
Eosinophils Relative: 0.1 % (ref 0.0–5.0)
HCT: 42.3 % (ref 39.0–52.0)
Hemoglobin: 14.5 g/dL (ref 13.0–17.0)
Lymphocytes Relative: 21.9 % (ref 12.0–46.0)
Lymphs Abs: 1.6 10*3/uL (ref 0.7–4.0)
MCHC: 34.2 g/dL (ref 30.0–36.0)
MCV: 82.7 fl (ref 78.0–100.0)
Monocytes Absolute: 0.4 10*3/uL (ref 0.1–1.0)
Monocytes Relative: 5.8 % (ref 3.0–12.0)
Neutro Abs: 5.2 10*3/uL (ref 1.4–7.7)
Neutrophils Relative %: 71.3 % (ref 43.0–77.0)
Platelets: 274 10*3/uL (ref 150.0–400.0)
RBC: 5.12 Mil/uL (ref 4.22–5.81)
RDW: 13.6 % (ref 11.5–15.5)
WBC: 7.3 10*3/uL (ref 4.0–10.5)

## 2021-05-05 LAB — SEDIMENTATION RATE: Sed Rate: 5 mm/hr (ref 0–15)

## 2021-05-05 LAB — C-REACTIVE PROTEIN: CRP: <1 mg/dL (ref 0.5–20.0)

## 2021-05-05 LAB — TSH: TSH: 1.46 u[IU]/mL (ref 0.35–5.50)

## 2021-05-05 IMAGING — MR MR ORBITS WO/W CM
6 series · 42 of 48 positions shown · IV contrast (10 GADAVIST)
Comparison: None.

CLINICAL DATA: Double vision.

Six nerve palsy on the right
EXAM:
MRI HEAD AND ORBITS WITHOUT AND WITH CONTRAST
TECHNIQUE: Multiplanar, multiecho pulse sequences of the brain and surrounding
structures were obtained without and with intravenous contrast.
Multiplanar, multiecho pulse sequences of the orbits and surrounding
structures were obtained including fat saturation techniques, before
and after intravenous contrast administration.
CONTRAST:  10mL GADAVIST GADOBUTROL 1 MMOL/ML IV SOLN

[Series 12: T2 fat-sat · axial · 3.0mm · 0.70mm/px · z∈[-8,+51]mm · 6 of 21 slices shown (1 of 2)]
[im 1/21]
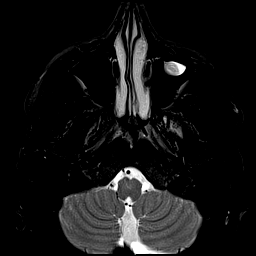
[im 5/21]
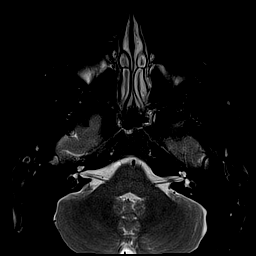
[im 9/21]
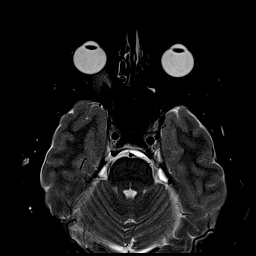
[im 13/21]
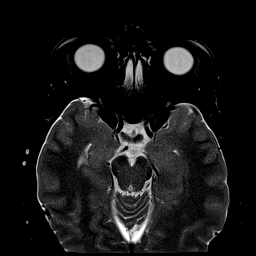
[im 17/21]
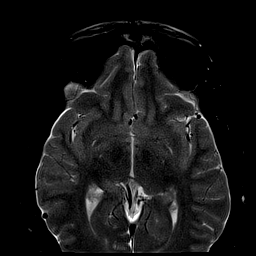
[im 21/21]
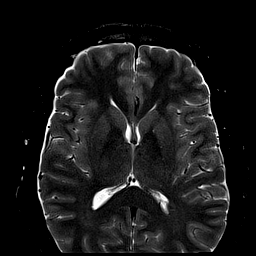

[Series 13: T2 fat-sat · coronal · 3.0mm · 0.35mm/px · 8 of 37 slices shown (2 of 2)]
[im 1/37]
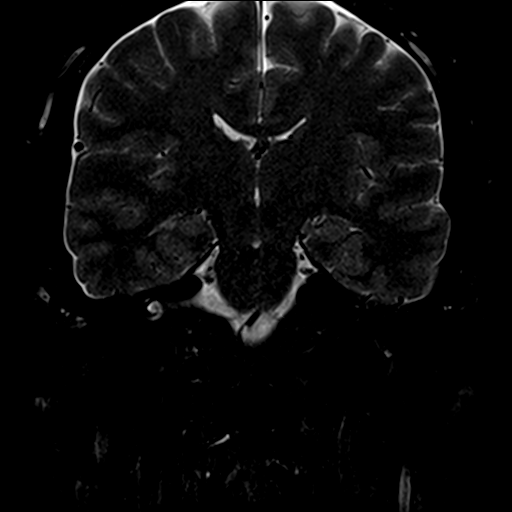
[im 5/37]
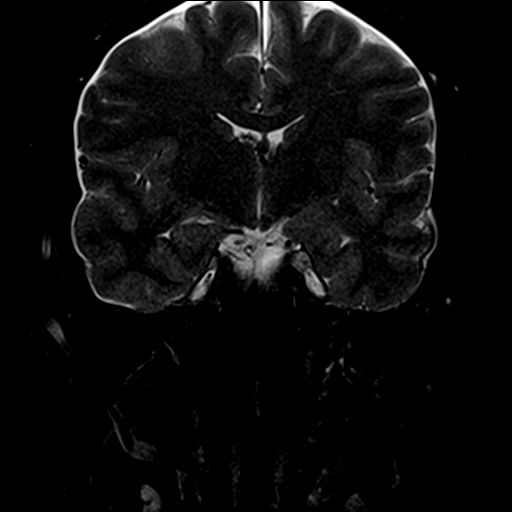
[im 13/37]
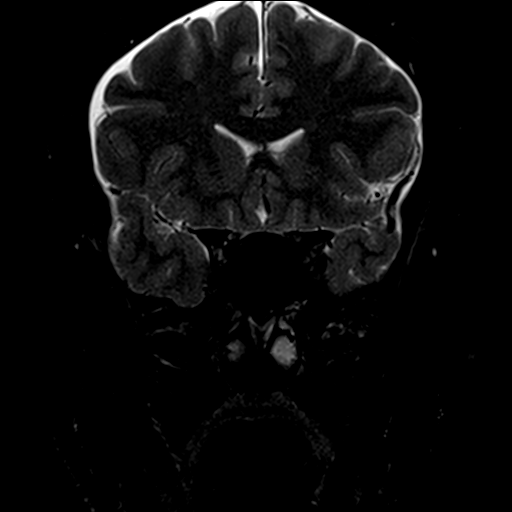
[im 17/37]
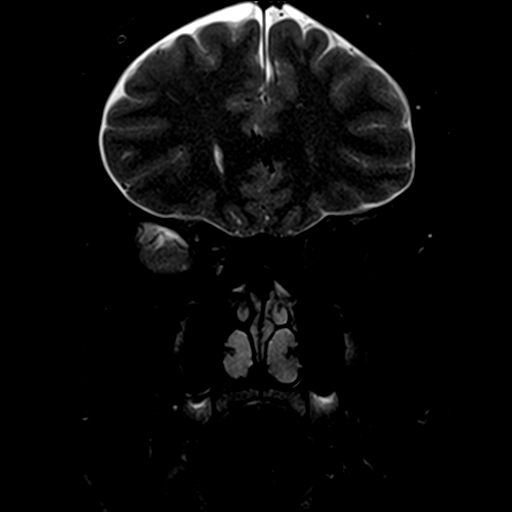
[im 21/37]
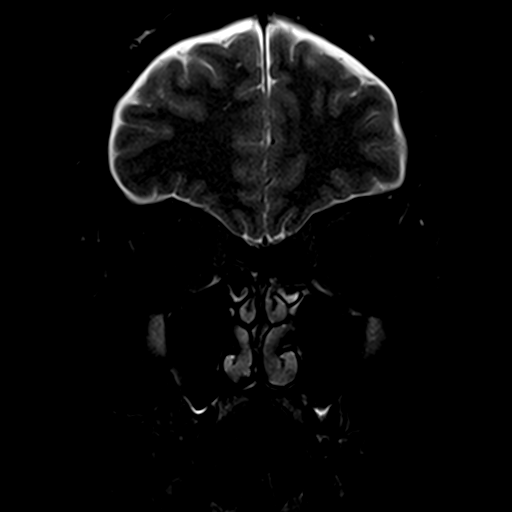
[im 25/37]
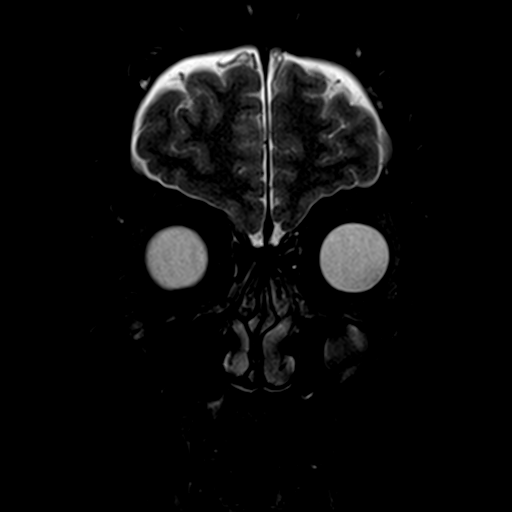
[im 33/37]
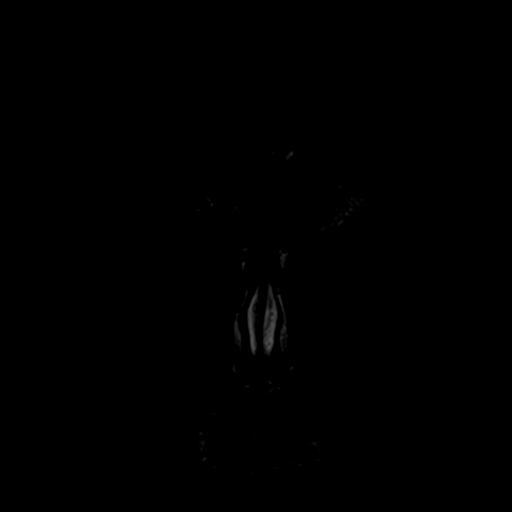
[im 37/37]
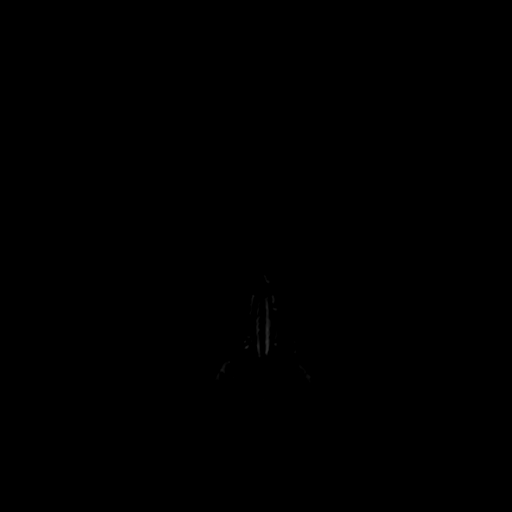

[Series 14: T1 · axial · non-contrast · 3.0mm · 0.70mm/px · z∈[-8,+51]mm · 6 of 21 slices shown (1 of 2)]
[im 1/21]
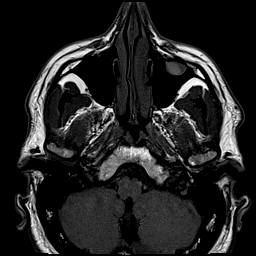
[im 5/21]
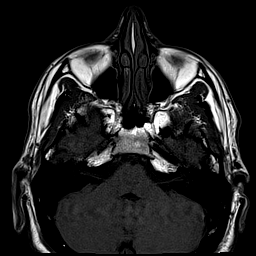
[im 9/21]
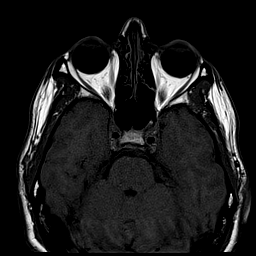
[im 13/21]
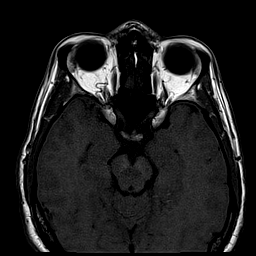
[im 17/21]
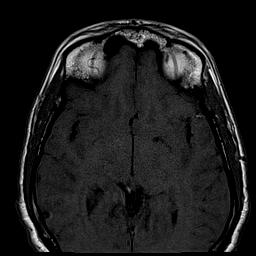
[im 21/21]
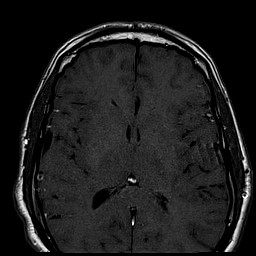

[Series 15: T1 · coronal · non-contrast · 3.0mm · 0.70mm/px · 8 of 37 slices shown (2 of 2)]
[im 1/37]
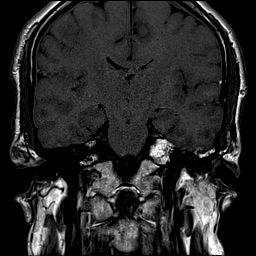
[im 5/37]
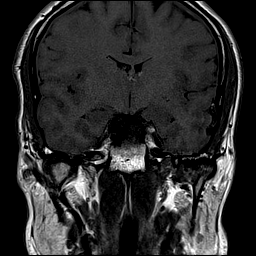
[im 13/37]
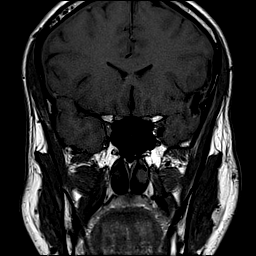
[im 17/37]
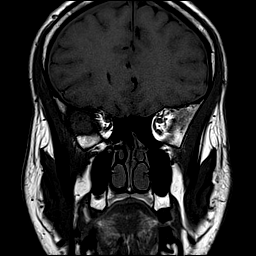
[im 21/37]
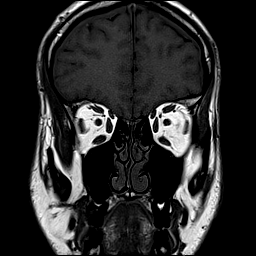
[im 25/37]
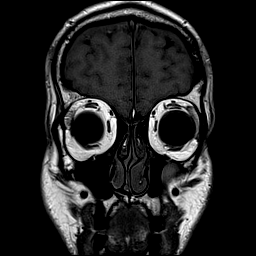
[im 33/37]
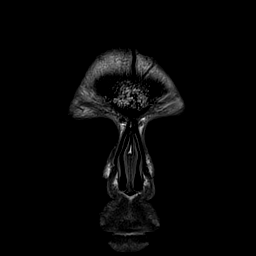
[im 37/37]
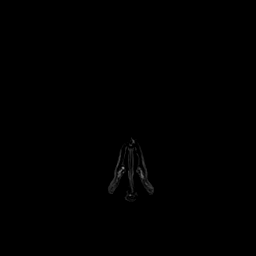

[Series 16: T1 fat-sat post-contrast · axial · 3.0mm · 0.70mm/px · z∈[-8,+51]mm · 6 of 21 slices shown (1 of 2)]
[im 1/21]
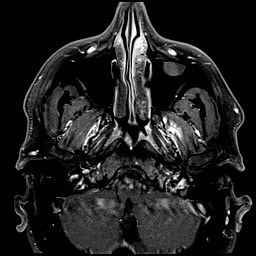
[im 5/21]
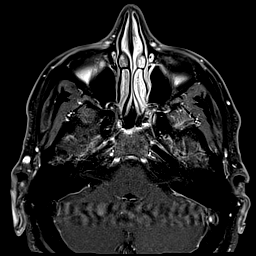
[im 9/21]
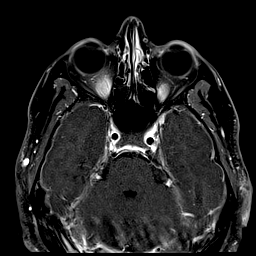
[im 13/21]
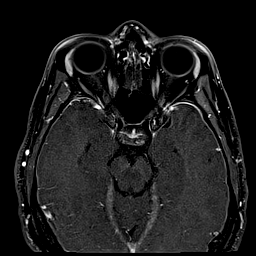
[im 17/21]
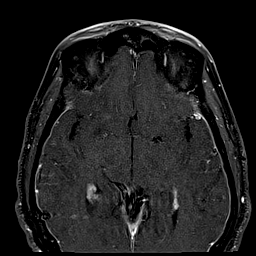
[im 21/21]
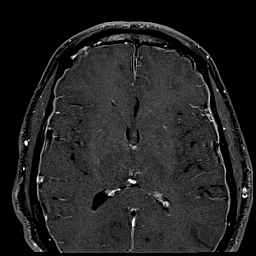

[Series 17: T1 fat-sat post-contrast · coronal · 3.0mm · 0.70mm/px · 8 of 37 slices shown (2 of 2)]
[im 1/37]
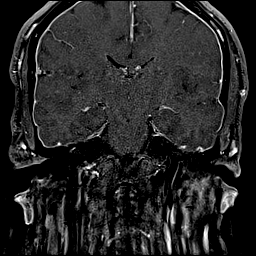
[im 5/37]
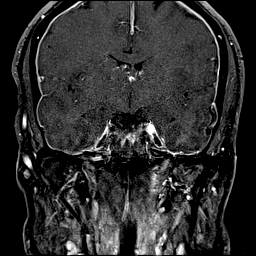
[im 13/37]
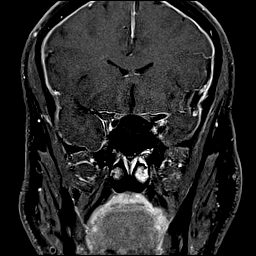
[im 17/37]
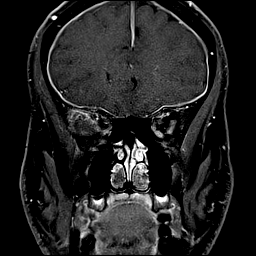
[im 21/37]
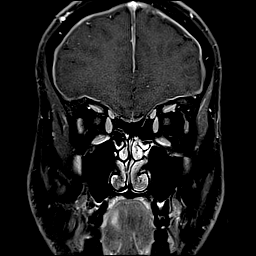
[im 25/37]
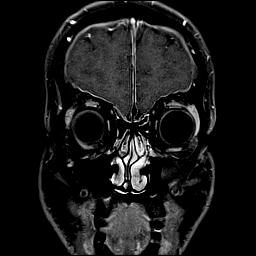
[im 33/37]
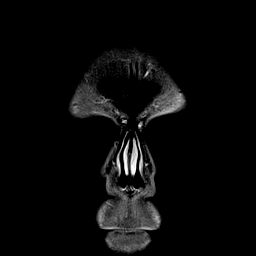
[im 37/37]
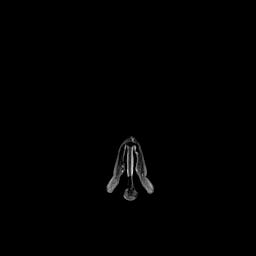

[42 of 48 positions shown; findings below may reference images not displayed]

FINDINGS: MRI HEAD FINDINGS

Brain: Generalized dural thickening with lobulated appearance but no
focal masslike areas. No superimposed masslike finding or osseous
abnormality. Best seen on sagittal postcontrast reformats there is
small subdural hygromas in the posterior fossa, without associated
mass effect. The cause is uncertain, usually this smooth generalized
appearance is related to intracranial hypotension, although no brain
sagging. Hypertrophic or granulomatous causes are usually more
lobulated with areas of masslike thickening. The dural venous
sinuses are diffusely patent/enhancing. No infarct, hydrocephalus,
or mass.

Vascular: Normal flow voids and vascular enhancements.

Skull and upper cervical spine: Normal marrow signal

MRI ORBITS FINDINGS

Orbits: No traumatic or inflammatory finding. Globes, optic nerves,
orbital fat, extraocular muscles, vascular structures, and lacrimal
glands are normal. No thinning or other asymmetry of the lateral
recti.

Visualized sinuses: Retention cysts in the left maxillary sinus.

Soft tissues: Negative for mass or inflammation.
IMPRESSION: 1. Diffuse dural thickening with small hygromas in the posterior
fossa. Involvement of the 6th cranial nerve could occur at
Dorrello's canal, although no focal finding on the symptomatic right
side.
2. No specific cause for the dural thickening, question recent
lumbar puncture or clinical signs of intracranial hypotension,
please see discussion above. Recommend neurology follow-up.
3. Normal MRI of the orbits.

## 2021-05-05 IMAGING — MR MR HEAD WO/W CM
13 series · 48 of 48 positions shown · IV contrast (10 GADAVIST)
Comparison: None.

CLINICAL DATA: Double vision.

Six nerve palsy on the right
EXAM:
MRI HEAD AND ORBITS WITHOUT AND WITH CONTRAST
TECHNIQUE: Multiplanar, multiecho pulse sequences of the brain and surrounding
structures were obtained without and with intravenous contrast.
Multiplanar, multiecho pulse sequences of the orbits and surrounding
structures were obtained including fat saturation techniques, before
and after intravenous contrast administration.
CONTRAST:  10mL GADAVIST GADOBUTROL 1 MMOL/ML IV SOLN

[Series 2: DWI · axial · 3.0mm · 1.20mm/px · z∈[-13,+144]mm · 6 of 110 slices shown (1 of 4)]
[im 1/110]
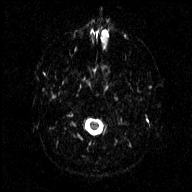
[im 22/110]
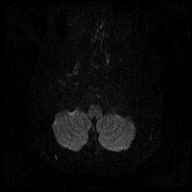
[im 44/110]
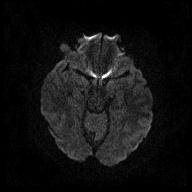
[im 66/110]
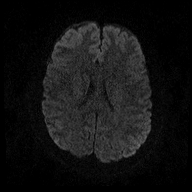
[im 88/110]
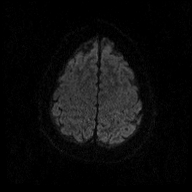
[im 110/110]
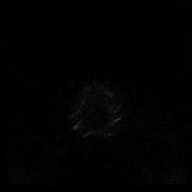

[Series 3: DWI · axial · 3.0mm · 1.20mm/px · z∈[-13,+144]mm · 3 of 54 slices shown (2 of 4)]
[im 1/54]
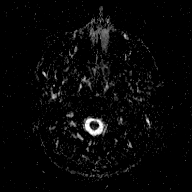
[im 27/54]
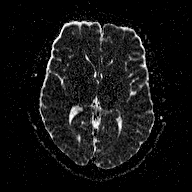
[im 54/54]
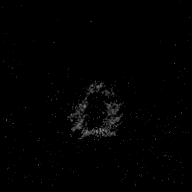

[Series 4: DWI · coronal · 3.0mm · 1.15mm/px · 6 of 99 slices shown (3 of 4)]
[im 1/99]
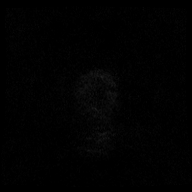
[im 20/99]
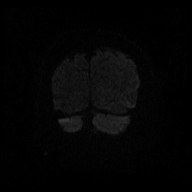
[im 40/99]
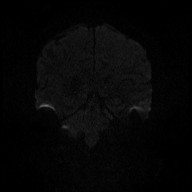
[im 59/99]
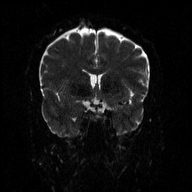
[im 79/99]
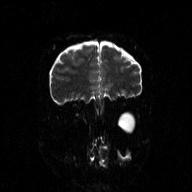
[im 99/99]
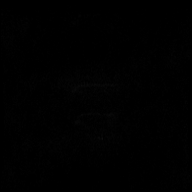

[Series 5: DWI · coronal · 3.0mm · 1.15mm/px · 3 of 48 slices shown (4 of 4)]
[im 1/48]
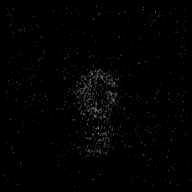
[im 24/48]
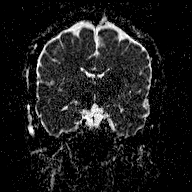
[im 48/48]
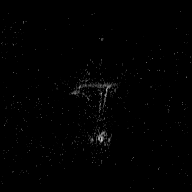

[Series 6: T1 · sagittal · 5.0mm · 0.45mm/px · 1 of 25 slices shown (1 of 2)]
[im 1/25]
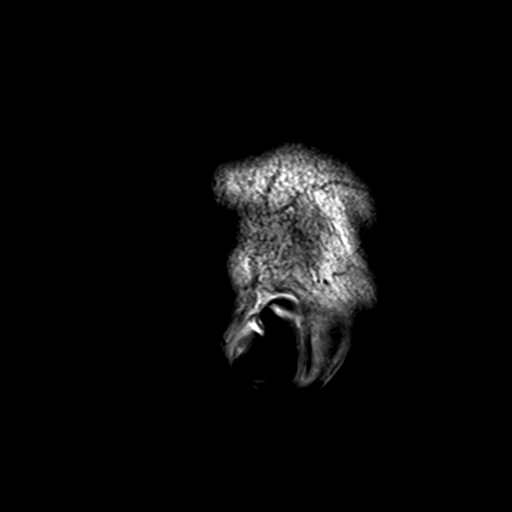

[Series 7: T2 · axial · 5.0mm · 0.72mm/px · 1 of 25 slices shown (1 of 2)]
[im 1/25]
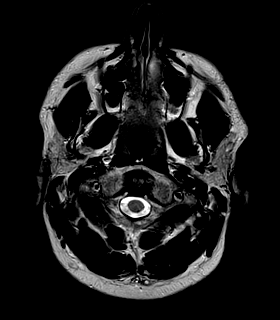

[Series 8: FLAIR · axial · 3.0mm · 0.45mm/px · z∈[-13,+144]mm · 3 of 55 slices shown (1 of 2)]
[im 1/55]
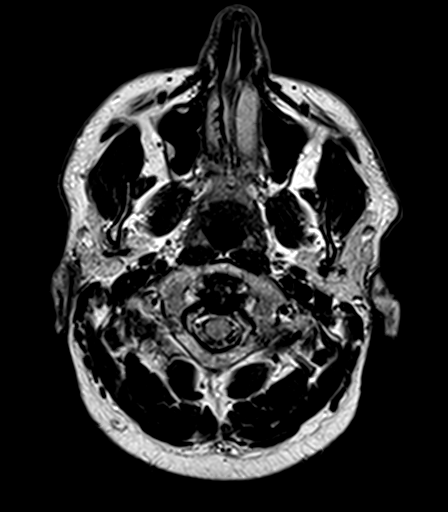
[im 28/55]
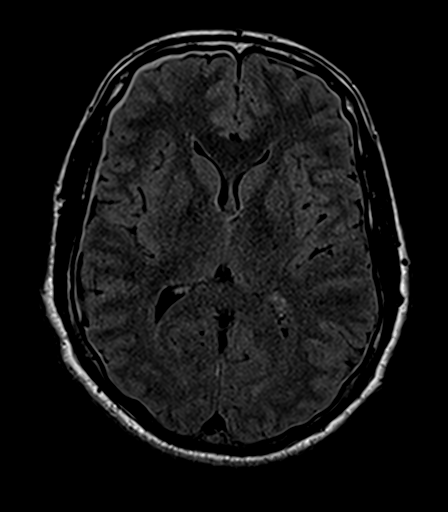
[im 55/55]
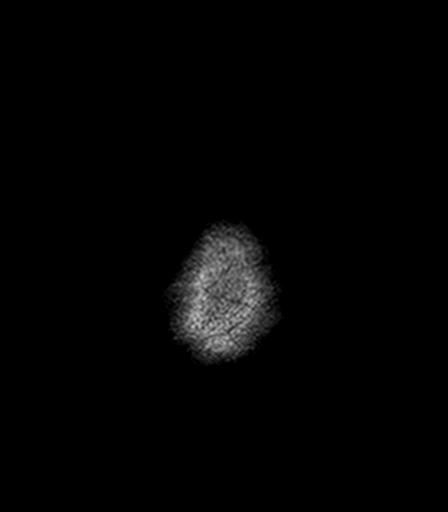

[Series 9: T2 · axial · 5.0mm · 0.72mm/px · 1 of 25 slices shown (2 of 2)]
[im 1/25]
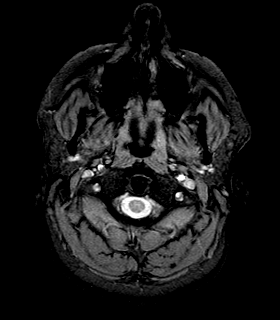

[Series 10: T1 · axial · 1.0mm · 0.94mm/px · z∈[-10,+144]mm · 9 of 160 slices shown (2 of 2)]
[im 1/160]
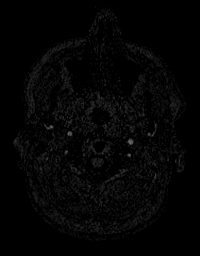
[im 20/160]
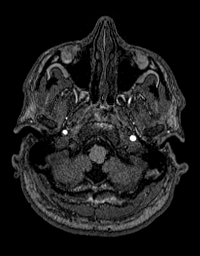
[im 40/160]
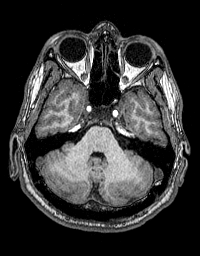
[im 60/160]
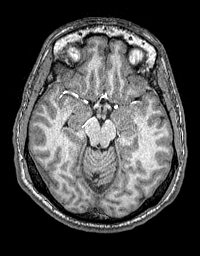
[im 80/160]
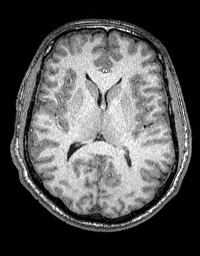
[im 100/160]
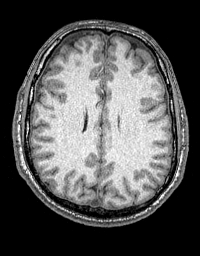
[im 120/160]
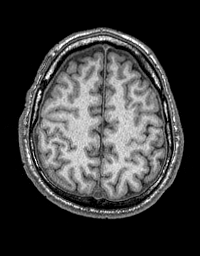
[im 140/160]
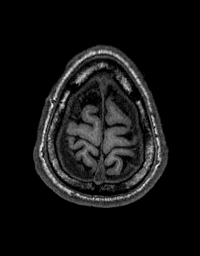
[im 160/160]
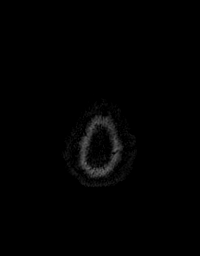

[Series 11: FLAIR · sagittal · 4.0mm · 0.45mm/px · 2 of 32 slices shown (2 of 2)]
[im 1/32]
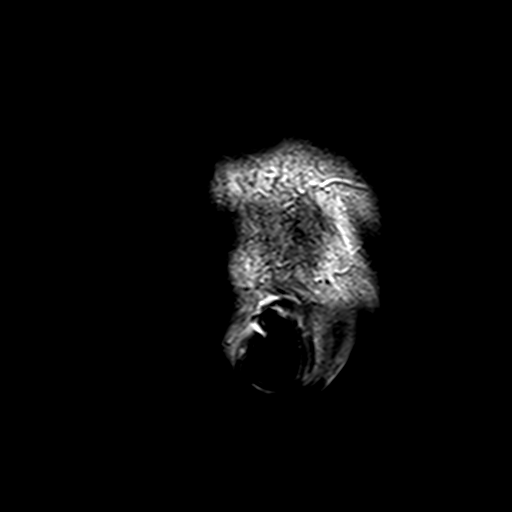
[im 32/32]
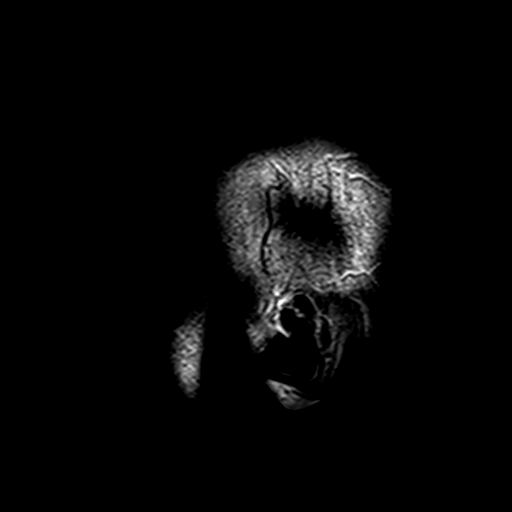

[Series 18: T2 post-contrast · coronal · 5.0mm · 0.43mm/px · 2 of 31 slices shown]
[im 1/31]
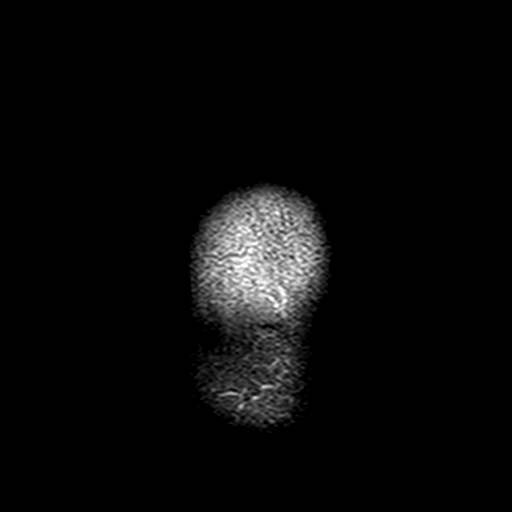
[im 31/31]
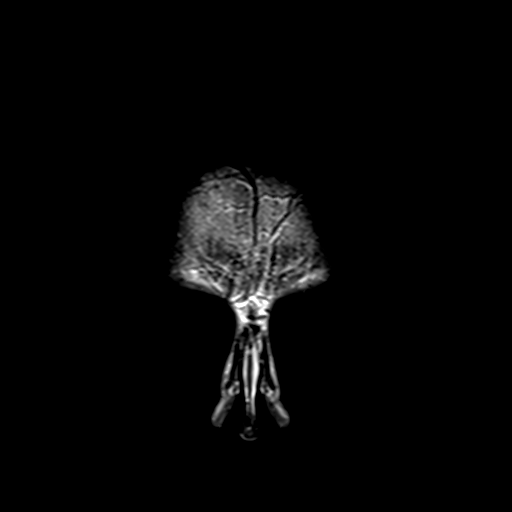

[Series 19: T1 post-contrast · axial · 1.0mm · 0.94mm/px · z∈[-10,+144]mm · 9 of 160 slices shown (1 of 2)]
[im 1/160]
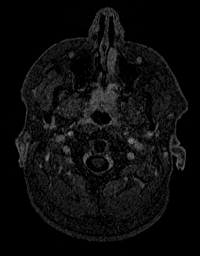
[im 20/160]
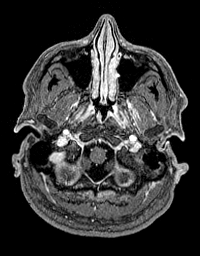
[im 40/160]
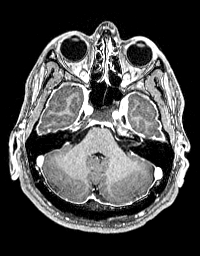
[im 60/160]
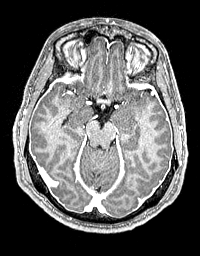
[im 80/160]
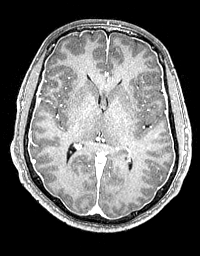
[im 100/160]
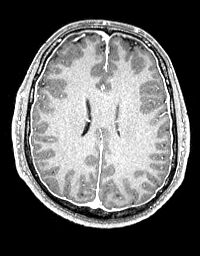
[im 120/160]
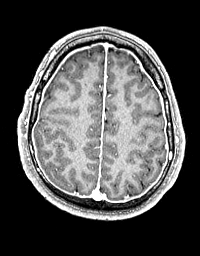
[im 140/160]
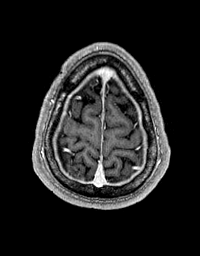
[im 160/160]
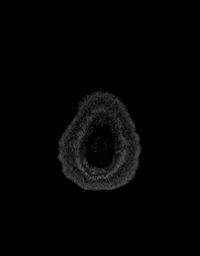

[Series 20: T1 post-contrast · coronal · 5.0mm · 0.43mm/px · 2 of 31 slices shown (2 of 2)]
[im 1/31]
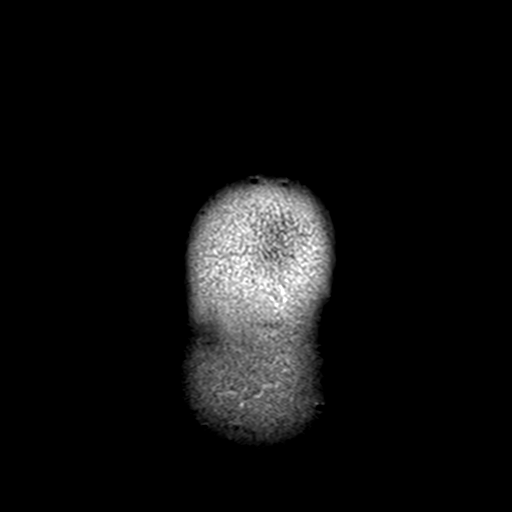
[im 31/31]
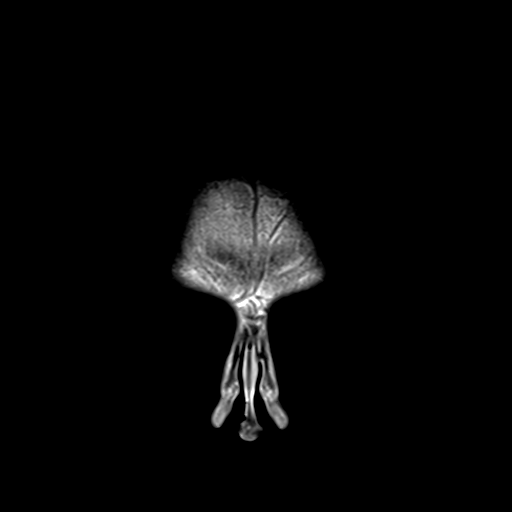

[48 of 48 positions shown; findings below may reference images not displayed]

FINDINGS: MRI HEAD FINDINGS

Brain: Generalized dural thickening with lobulated appearance but no
focal masslike areas. No superimposed masslike finding or osseous
abnormality. Best seen on sagittal postcontrast reformats there is
small subdural hygromas in the posterior fossa, without associated
mass effect. The cause is uncertain, usually this smooth generalized
appearance is related to intracranial hypotension, although no brain
sagging. Hypertrophic or granulomatous causes are usually more
lobulated with areas of masslike thickening. The dural venous
sinuses are diffusely patent/enhancing. No infarct, hydrocephalus,
or mass.

Vascular: Normal flow voids and vascular enhancements.

Skull and upper cervical spine: Normal marrow signal

MRI ORBITS FINDINGS

Orbits: No traumatic or inflammatory finding. Globes, optic nerves,
orbital fat, extraocular muscles, vascular structures, and lacrimal
glands are normal. No thinning or other asymmetry of the lateral
recti.

Visualized sinuses: Retention cysts in the left maxillary sinus.

Soft tissues: Negative for mass or inflammation.
IMPRESSION: 1. Diffuse dural thickening with small hygromas in the posterior
fossa. Involvement of the 6th cranial nerve could occur at
Dorrello's canal, although no focal finding on the symptomatic right
side.
2. No specific cause for the dural thickening, question recent
lumbar puncture or clinical signs of intracranial hypotension,
please see discussion above. Recommend neurology follow-up.
3. Normal MRI of the orbits.

## 2021-05-05 MED ORDER — GADOBUTROL 1 MMOL/ML IV SOLN
10.0000 mL | Freq: Once | INTRAVENOUS | Status: AC | PRN
  Administered 2021-05-05: 10 mL via INTRAVENOUS

## 2021-05-07 ENCOUNTER — Telehealth: Payer: Self-pay | Admitting: Family Medicine

## 2021-05-07 ENCOUNTER — Encounter: Payer: Self-pay | Admitting: Neurology

## 2021-05-07 DIAGNOSIS — H4921 Sixth [abducent] nerve palsy, right eye: Secondary | ICD-10-CM

## 2021-05-07 DIAGNOSIS — R9089 Other abnormal findings on diagnostic imaging of central nervous system: Secondary | ICD-10-CM

## 2021-05-07 MED ORDER — TRAMADOL HCL 50 MG PO TABS: 50.0000 mg | ORAL_TABLET | Freq: Three times a day (TID) | ORAL | 0 refills | Status: DC | PRN

## 2021-05-07 NOTE — Telephone Encounter (Signed)
Spoke with patient about MRI findings.  Eye MD not in town for 2 weeks.   No change in symptoms. No fever, no new neuro changes.  Wearing eye patch, has head pressure that builds during the day.  Requests med for head pressure... sent in RX for tramadol.  No location preference for urgent neuro referral.

## 2021-05-11 NOTE — Progress Notes (Signed)
NEUROLOGY CONSULTATION NOTE  Randall Barnett MRN: [id number] DOB: 04-09-1981  Referring provider: Kerby Nora, MD Primary care provider: Kerby Nora, MD  Reason for consult:  right 6th nerve palsy, abnormal brain MRI  Assessment/Plan:   New onset headache aggravated by valsalva/cough with clear fluid drainage from right ear and nostril and MRI findings suspicious for intracranial hypotension - concern for CSF leak due to defect in sinuses and/or temporal bone. 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  History supplemented by referring provider's note.  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  He said he woke up that morning with a crick in his neck but otherwise felt okay.  He was seen in the Urgent Care on 04/17/2021 and was diagnosed with migraine.  He developed a persistent diffuse headache radiating from the back of his head.  Worse when upright, improved when laying down.  Coughing and sneezing significantly aggravated the pain.  On 9/23, he had sudden onset of emesis.  The next day he noted double vision.  He saw an ophthalmologist and was  diagnosed with a right 6th nerve palsy.  He also started experiencing clear drainage from right ear as well as clear rhinorrhea from the right nostril with metallic taste.  He had an MRI of the brain and orbits with and without contrast on 05/06/2021 personally reviewed and revealed diffuse dural thickening with small hygromas in the posterior fossa.  Current NSAIDS/analgesics:  tramadol Current anti-emetic:  Zofran ODT 4mg  Current muscle relaxants:  tizanidine 4mg  TID Current Antidepressant medications:  duloxetine 60mg  daily, trazodone     PAST MEDICAL HISTORY: Past Medical History:  Diagnosis Date   Depression    History of chicken pox     PAST SURGICAL HISTORY: Past Surgical History:  Procedure Laterality Date   Vertical Gastric Sleeve  2015    MEDICATIONS: Current Outpatient Medications on File Prior to Visit  Medication Sig Dispense Refill   ALPRAZolam (XANAX) 0.5 MG tablet Take 0.5 mg by mouth 2 (two) times daily as needed.     DULoxetine (CYMBALTA) 60 MG capsule TAKE ONE CAPSULE BY MOUTH ONE TIME DAILY 90 capsule 0   ondansetron (ZOFRAN ODT) 4 MG disintegrating tablet Take 1 tablet (4 mg total) by mouth every 8 (eight) hours as needed for nausea or vomiting. 20 tablet 0   tiZANidine (ZANAFLEX) 4 MG tablet Take 1 tablet (4 mg total) by mouth 3 (three) times daily. 30 tablet 0   traMADol (ULTRAM) 50 MG tablet Take 1 tablet (50 mg total) by mouth every 8 (eight) hours as needed for  up to 5 days. 15 tablet 0   traZODone (DESYREL) 50 MG tablet TAKE 2 TABLETS EVERY NIGHT AS NEEDED FOR INSOMNIA     No current facility-administered medications on file prior to visit.    ALLERGIES: Allergies  Allergen Reactions   Vicodin Hp [Hydrocodone-Acetaminophen] Other (See Comments)    Itchy Throat    FAMILY HISTORY: Family History  Problem Relation Age of Onset   Cancer Paternal Uncle        lung cancer   Cancer Paternal Grandmother        pancreatic    Objective:  Blood  pressure 129/71, pulse 68, height 5\' 10"  (1.778 m), weight 264 lb 12.8 oz (120.1 kg), SpO2 97 %. General: No acute distress.  Patient appears well-groomed.   Head:  Normocephalic/atraumatic Eyes:  fundi examined but not visualized Neck: supple, no paraspinal tenderness, full range of motion Back: No paraspinal tenderness Heart: regular rate and rhythm Lungs: Clear to auscultation bilaterally. Vascular: No carotid bruits. Neurological Exam: Mental status: alert and oriented to person, place, and time, recent and remote memory intact, fund of knowledge intact, attention and concentration intact, speech fluent and not dysarthric, language intact. Cranial nerves: CN I: not tested CN II: pupils equal, round and reactive to light, visual fields intact CN III, IV, VI:  disconjugate on orthophoric gaze, full range of motion, no nystagmus, no ptosis CN V: facial sensation intact. CN VII: upper and lower face symmetric CN VIII: hearing intact CN IX, X: gag intact, uvula midline CN XI: sternocleidomastoid and trapezius muscles intact CN XII: tongue midline Bulk & Tone: normal, no fasciculations. Motor:  muscle strength 5/5 throughout Sensation:  Pinprick, temperature and vibratory sensation intact. Deep Tendon Reflexes:  2+ throughout,  toes downgoing.   Finger to nose testing:  Without dysmetria.   Heel to shin:  Without dysmetria.   Gait:  Normal station and stride.  Romberg negative.    Thank you for allowing me to take part in the care of this patient.  , DO  CC: Shon Millet, MD

## 2021-05-12 ENCOUNTER — Encounter: Payer: Self-pay | Admitting: Neurology

## 2021-05-12 ENCOUNTER — Ambulatory Visit (INDEPENDENT_AMBULATORY_CARE_PROVIDER_SITE_OTHER): Payer: No Typology Code available for payment source | Admitting: Neurology

## 2021-05-12 ENCOUNTER — Other Ambulatory Visit: Payer: Self-pay

## 2021-05-12 VITALS — BP 129/71 | HR 68 | Ht 70.0 in | Wt 264.8 lb

## 2021-05-12 DIAGNOSIS — H4921 Sixth [abducent] nerve palsy, right eye: Secondary | ICD-10-CM

## 2021-05-12 DIAGNOSIS — G96810 Intracranial hypotension, unspecified: Secondary | ICD-10-CM

## 2021-05-12 DIAGNOSIS — G9601 Cranial cerebrospinal fluid leak, spontaneous: Secondary | ICD-10-CM | POA: Diagnosis not present

## 2021-05-12 NOTE — Patient Instructions (Addendum)
Will send you to [facility]  In the meantime, limit lifting to no more than 10 lbs.  Also keep hydrated and increase caffeine intake. Try to keep laying down if possible.

## 2021-05-13 ENCOUNTER — Telehealth: Payer: Self-pay | Admitting: Neurology

## 2021-05-13 NOTE — Telephone Encounter (Signed)
Pt called no answer left a voice mail to call the office back Per Dr Everlena Cooper it is ok to use a heated blanket on his back,

## 2021-05-13 NOTE — Telephone Encounter (Signed)
Patient called and left a message requesting a call back.   He'd like to know if it is okay to use a heated blanket on his back.  Patient was seen yesterday

## 2021-05-18 ENCOUNTER — Other Ambulatory Visit: Payer: Self-pay | Admitting: Family Medicine

## 2021-05-18 NOTE — Telephone Encounter (Signed)
Last office visit 05/04/2021 for headache.  Tramadol is not on current medication list.  Refill?

## 2021-06-06 ENCOUNTER — Other Ambulatory Visit: Payer: Self-pay | Admitting: Family Medicine

## 2021-06-06 NOTE — Telephone Encounter (Signed)
Last office visit 05/04/21 for HA.  Last refilled:  Tramadol not on current medication list.  No future appointment with PCP.

## 2021-06-23 ENCOUNTER — Encounter: Payer: Self-pay | Admitting: Family Medicine

## 2021-06-23 ENCOUNTER — Other Ambulatory Visit: Payer: Self-pay | Admitting: Family Medicine

## 2021-06-23 MED ORDER — TRAZODONE HCL 50 MG PO TABS: ORAL_TABLET | ORAL | 5 refills | Status: DC

## 2021-06-23 MED ORDER — DULOXETINE HCL 60 MG PO CPEP
60.0000 mg | ORAL_CAPSULE | Freq: Every day | ORAL | 1 refills | Status: DC
Start: 2021-06-23 — End: 2022-02-21

## 2021-06-23 NOTE — Telephone Encounter (Signed)
I was MyCharting with him earlier today and he states he did request refill on Tramadol.  I will MyChart him with your questions.

## 2021-06-23 NOTE — Telephone Encounter (Signed)
Last office visit 05/04/2021 for Headache.  Tramadol not on current medication list.  No future appointments with PCP.

## 2021-06-23 NOTE — Telephone Encounter (Signed)
Last office visit 05/04/2021 for Headache.  Tizanidine last refilled 04/28/21 for #30 with no refills.  No future appointments with PCP.

## 2021-06-28 ENCOUNTER — Telehealth: Payer: Self-pay | Admitting: *Deleted

## 2021-06-28 MED ORDER — TIZANIDINE HCL 4 MG PO TABS: 4.0000 mg | ORAL_TABLET | Freq: Three times a day (TID) | ORAL | 0 refills | Status: DC

## 2021-06-28 NOTE — Telephone Encounter (Signed)
Received fax from [facility] requesting PA for Tramadol 50 mg.  PA approved from 06/28/2021 to 12/25/2021.  [facility] notified of approval via fax.

## 2021-06-28 NOTE — Telephone Encounter (Signed)
Okay.. I just wanted to make sure he was evaluated and had a plan and was not just taking tramadol without being seen. I sent in tizanidine and tramadol.

## 2021-06-30 ENCOUNTER — Encounter: Payer: Self-pay | Admitting: Neurology

## 2021-06-30 NOTE — Telephone Encounter (Signed)
Called [phone] to get the fax number waiting for a call back

## 2021-07-06 ENCOUNTER — Encounter: Payer: Self-pay | Admitting: Neurology

## 2021-07-15 ENCOUNTER — Other Ambulatory Visit: Payer: Self-pay | Admitting: Family Medicine

## 2021-07-18 ENCOUNTER — Other Ambulatory Visit: Payer: Self-pay | Admitting: Family Medicine

## 2021-07-19 ENCOUNTER — Other Ambulatory Visit: Payer: Self-pay | Admitting: Family Medicine

## 2021-07-19 NOTE — Telephone Encounter (Signed)
Last office visit 05/04/2021 for headache and 6th nerve palsy.  Last refilled 06/28/2021 for #30 with no refills.  No future appointments with PCP.

## 2021-07-19 NOTE — Telephone Encounter (Signed)
Last office visit 05/04/2021 for headache and 6th nerve palsy.  Last refilled: Tramadol not on current medication list.  No future appointments with PCP.

## 2021-07-27 ENCOUNTER — Encounter: Payer: Self-pay | Admitting: Family Medicine

## 2021-07-28 MED ORDER — TIZANIDINE HCL 4 MG PO TABS: 4.0000 mg | ORAL_TABLET | Freq: Three times a day (TID) | ORAL | 0 refills | Status: DC

## 2021-07-28 MED ORDER — TRAMADOL HCL 50 MG PO TABS: 25.0000 mg | ORAL_TABLET | Freq: Two times a day (BID) | ORAL | 0 refills | Status: AC | PRN

## 2021-08-21 ENCOUNTER — Other Ambulatory Visit: Payer: Self-pay | Admitting: Family Medicine

## 2021-08-22 NOTE — Telephone Encounter (Signed)
Last office visit 05/04/21 for Headache.  Last refilled 07/28/21 for #90 with no refills.  No future appointments with PCP.

## 2021-08-25 ENCOUNTER — Telehealth: Payer: No Typology Code available for payment source | Admitting: Family

## 2021-08-25 DIAGNOSIS — R051 Acute cough: Secondary | ICD-10-CM

## 2021-08-25 MED ORDER — BENZONATATE 100 MG PO CAPS: 100.0000 mg | ORAL_CAPSULE | Freq: Three times a day (TID) | ORAL | 0 refills | Status: DC | PRN

## 2021-08-25 NOTE — Progress Notes (Signed)
We are sorry that you are not feeling well.  Here is how we plan to help!  Based on your presentation I believe you most likely have A cough due to a virus.  This is called viral bronchitis and is best treated by rest, plenty of fluids and control of the cough.  You may use Ibuprofen or Tylenol as directed to help your symptoms.     In addition you may use A non-prescription cough medication called Robitussin DAC. Take 2 teaspoons every 8 hours or Delsym: take 2 teaspoons every 12 hours., A non-prescription cough medication called Mucinex DM: take 2 tablets every 12 hours., and A prescription cough medication called Tessalon Perles 100mg. You may take 1-2 capsules every 8 hours as needed for your cough.    From your responses in the eVisit questionnaire you describe inflammation in the upper respiratory tract which is causing a significant cough.  This is commonly called Bronchitis and has four common causes:   Allergies Viral Infections Acid Reflux Bacterial Infection Allergies, viruses and acid reflux are treated by controlling symptoms or eliminating the cause. An example might be a cough caused by taking certain blood pressure medications. You stop the cough by changing the medication. Another example might be a cough caused by acid reflux. Controlling the reflux helps control the cough.  USE OF BRONCHODILATOR ("RESCUE") INHALERS: There is a risk from using your bronchodilator too frequently.  The risk is that over-reliance on a medication which only relaxes the muscles surrounding the breathing tubes can reduce the effectiveness of medications prescribed to reduce swelling and congestion of the tubes themselves.  Although you feel brief relief from the bronchodilator inhaler, your asthma may actually be worsening with the tubes becoming more swollen and filled with mucus.  This can delay other crucial treatments, such as oral steroid medications. If you need to use a bronchodilator inhaler  daily, several times per day, you should discuss this with your provider.  There are probably better treatments that could be used to keep your asthma under control.     HOME CARE Only take medications as instructed by your medical team. Complete the entire course of an antibiotic. Drink plenty of fluids and get plenty of rest. Avoid close contacts especially the very young and the elderly Cover your mouth if you cough or cough into your sleeve. Always remember to wash your hands A steam or ultrasonic humidifier can help congestion.   GET HELP RIGHT AWAY IF: You develop worsening fever. You become short of breath You cough up blood. Your symptoms persist after you have completed your treatment plan MAKE SURE YOU  Understand these instructions. Will watch your condition. Will get help right away if you are not doing well or get worse.    Thank you for choosing an e-visit.  Your e-visit answers were reviewed by a board certified advanced clinical practitioner to complete your personal care plan. Depending upon the condition, your plan could have included both over the counter or prescription medications.  Please review your pharmacy choice. Make sure the pharmacy is open so you can pick up prescription now. If there is a problem, you may contact your provider through MyChart messaging and have the prescription routed to another pharmacy.  Your safety is important to us. If you have drug allergies check your prescription carefully.   For the next 24 hours you can use MyChart to ask questions about today's visit, request a non-urgent call back, or ask for a   work or school excuse. You will get an email in the next two days asking about your experience. I hope that your e-visit has been valuable and will speed your recovery.   Approximately 5 minutes was spent documenting and reviewing patient's chart.   

## 2021-09-15 ENCOUNTER — Other Ambulatory Visit: Payer: Self-pay | Admitting: Family Medicine

## 2021-09-15 NOTE — Telephone Encounter (Signed)
Refill request Zanaflex Last refill 08/23/21 #90 Last office visit 05/04/21

## 2021-10-05 ENCOUNTER — Encounter: Payer: Self-pay | Admitting: Family Medicine

## 2021-10-05 DIAGNOSIS — R41840 Attention and concentration deficit: Secondary | ICD-10-CM

## 2021-10-10 ENCOUNTER — Other Ambulatory Visit: Payer: Self-pay | Admitting: Family Medicine

## 2021-10-10 NOTE — Telephone Encounter (Signed)
Last office visit 05/04/21 for Headache.  Last refilled 09/16/21 for #90 with no refills.  No future appointments with PCP.  ?

## 2021-11-08 ENCOUNTER — Other Ambulatory Visit: Payer: Self-pay | Admitting: Family Medicine

## 2021-11-08 NOTE — Telephone Encounter (Signed)
Last office visit 05/04/2021 for headache.  Last refilled 10/11/2021 for #90 with no refills.  No future appointments with PCP.  ?

## 2021-11-23 NOTE — Progress Notes (Deleted)
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     UPDATE: Concern for spontaneous intracranial hypotension.  Hs he endorsed clear fluid from right ear and nostril, seen by [facility] ENT Rhinology who did not find a skull/maxillofacial defect.  He was then referred to [facility] Radiology for evaluation of CSF leak involving the spinal canal.  In January, he underwent blood patches at left T6-T7, right T6-T7 and right T12-L1.    HISTORY: On 04/15/2021, he developed a sudden onset severe headache with photophobia, nausea and vomiting.  He said he woke up that morning with a crick in his neck but otherwise felt okay.  He was seen in the Urgent Care on 04/17/2021 and was diagnosed with migraine.  He developed a persistent diffuse headache radiating from the back of his head.  Worse when upright, improved when laying down.  Coughing and sneezing significantly aggravated the pain.  On 9/23, he had sudden onset of emesis.  The next day he noted double vision.  He saw an ophthalmologist and was diagnosed with a right 6th nerve palsy.  He also started experiencing clear drainage from right ear as well as clear rhinorrhea from the right nostril with metallic taste.  He had an MRI of the brain and orbits with and without contrast on 05/06/2021 personally reviewed and  revealed diffuse dural thickening with small hygromas in the posterior fossa.   Current NSAIDS/analgesics:  tramadol Current anti-emetic:  Zofran ODT 4mg  Current muscle relaxants:  tizanidine 4mg  TID Current Antidepressant medications:  duloxetine 60mg  daily, trazodone  PAST MEDICAL HISTORY: Past Medical History:  Diagnosis Date   Depression    History of chicken pox     MEDICATIONS: Current Outpatient Medications on File Prior to Visit  Medication Sig Dispense Refill   ALPRAZolam (XANAX) 0.5 MG tablet Take 0.5 mg by mouth 2 (two) times daily as needed.     benzonatate (TESSALON PERLES) 100 MG capsule Take 1 capsule (100 mg total) by mouth 3 (three) times daily as needed. 20 capsule 0   DULoxetine (CYMBALTA) 60 MG capsule Take 1 capsule (60 mg total) by mouth daily. 90 capsule 1   ondansetron (ZOFRAN ODT) 4 MG disintegrating tablet Take 1 tablet (4 mg total) by mouth every 8 (eight) hours as needed for nausea or vomiting. 20 tablet 0   tiZANidine (ZANAFLEX) 4 MG tablet TAKE 1 TABLET BY MOUTH THREE TIMES A DAY 90 tablet 0   traZODone (DESYREL) 50 MG tablet TAKE 2 TABLETS EVERY NIGHT AS NEEDED FOR INSOMNIA 180 tablet 1   No current facility-administered medications on file prior to visit.    ALLERGIES: Allergies  Allergen Reactions   Vicodin Hp [Hydrocodone-Acetaminophen] Other (See Comments)    Itchy Throat    FAMILY HISTORY: Family History  Problem  Relation Age of Onset   Cancer Paternal Uncle        lung cancer   Cancer Paternal Grandmother        pancreatic      Objective:  *** General: No acute distress.  Patient appears ***-groomed.   Head:  Normocephalic/atraumatic Eyes:  Fundi examined but not visualized Neck: supple, no paraspinal tenderness, full range of motion Heart:  Regular rate and rhythm Lungs:  Clear to auscultation bilaterally Back: No paraspinal tenderness Neurological Exam: alert and oriented to person, place, and time.  Speech fluent and not  dysarthric, language intact.  CN II-XII intact. Bulk and tone normal, muscle strength 5/5 throughout.  Sensation to light touch intact.  Deep tendon reflexes 2+ throughout, toes downgoing.  Finger to nose testing intact.  Gait normal, Romberg negative.   Shon Millet, DO  CC: ***

## 2021-11-25 ENCOUNTER — Ambulatory Visit: Payer: BC Managed Care – PPO | Admitting: Neurology

## 2021-11-25 ENCOUNTER — Encounter: Payer: Self-pay | Admitting: Neurology

## 2021-11-25 DIAGNOSIS — Z029 Encounter for administrative examinations, unspecified: Secondary | ICD-10-CM

## 2021-12-06 ENCOUNTER — Other Ambulatory Visit: Payer: Self-pay | Admitting: Family Medicine

## 2021-12-06 NOTE — Telephone Encounter (Signed)
Last office visit 05/04/2021 headache.  Last refilled 11/09/2021 for #90 with no refills.  No future appointments.  ?

## 2021-12-14 ENCOUNTER — Ambulatory Visit (INDEPENDENT_AMBULATORY_CARE_PROVIDER_SITE_OTHER): Payer: BC Managed Care – PPO | Admitting: Psychology

## 2021-12-14 ENCOUNTER — Encounter: Payer: Self-pay | Admitting: Psychology

## 2021-12-14 DIAGNOSIS — F411 Generalized anxiety disorder: Secondary | ICD-10-CM

## 2021-12-14 NOTE — Progress Notes (Signed)
                Salimah Martinovich, PhD 
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
## 2021-12-28 ENCOUNTER — Encounter: Payer: Self-pay | Admitting: Psychology

## 2021-12-28 ENCOUNTER — Ambulatory Visit (INDEPENDENT_AMBULATORY_CARE_PROVIDER_SITE_OTHER): Payer: BC Managed Care – PPO | Admitting: Psychology

## 2021-12-28 DIAGNOSIS — F411 Generalized anxiety disorder: Secondary | ICD-10-CM

## 2021-12-28 NOTE — Progress Notes (Signed)
                Nyoka Alcoser, PhD 

## 2021-12-28 NOTE — Progress Notes (Signed)
[facility]  Progress Note  Patient ID: Randall Barnett, MRN: [id number],    Date: 12/28/2021  Time Spent: 12:00 - 2:30pm   Treatment Type: Testing  Met with patient for testing session.  Patient was at home and session was conducted from therapist's office via video conferencing.  Patient verbally consented to telehealth.  Reported Symptoms: Reason for Visit /Presenting Problem: Patient complains of impaired attention, distractibility, poor organization, restlessness, and impulsivity since childhood, but not noticed due to other family members being highly explosive and violent. This has been impacting his work, especially the last three years.  He has a history of trauma, anxiety, and depressed mood.  Testing recommended to evaluate for ADHD as well as other conditions that may be affecting attention.    Mental Status Exam: Appearance:  Neat and Adequately Groomed     Behavior: Appropriate  Motor: Normal  Speech/Language:  Clear and Coherent and Normal Rate  Affect: Appropriate  Mood: normal  Thought process: Normal  Thought content:   WNL  Sensory/Perceptual disturbances:   WNL  Orientation: oriented to person, place, time/date, and situation  Attention: Good  Concentration: Fair  Memory: WNL  Fund of knowledge:  Good  Insight:   Good  Judgment:  Good  Impulse Control: Fair   Risk Assessment: Danger to Self:  No Self-injurious Behavior: No Danger to Others: No  Behavior Observations: Patient was cooperative and displayed good effort. Attention and concentration were inconsistent overall, as patient exhibited several instances each of self-correction and needing questions and instructions to be repeated.  He occasionally solved problems correctly after the standardized time limit and missed several relatively easy problems or questions.  Mood was anxious with appropriate affect.  The results appear representative of current functioning.    Subjective: Testing  included the WAIS-IV (1.5 hrs. for testing and scoring) along with the CNS Vital signs (0.75 hrs.) and BRIEF-A (0.25 hrs.).     Diagnosis:Generalized anxiety disorder  Plan: Testing complete. Report writing to be conducted followed by interactive feedback next session.    Rainey Pines, PhD

## 2021-12-29 ENCOUNTER — Encounter: Payer: Self-pay | Admitting: Psychology

## 2021-12-29 NOTE — Progress Notes (Signed)
Randall Barnett is a 41 y.o. male patient Report writing competed ( 2 hrs.).  Interactive feedback to be conducted next session. Report to be attached to the feedback progress note. Marland Kitchen       Bryson Dames, PhD

## 2022-01-11 ENCOUNTER — Other Ambulatory Visit: Payer: Self-pay | Admitting: Family Medicine

## 2022-01-12 ENCOUNTER — Other Ambulatory Visit: Payer: Self-pay | Admitting: Family Medicine

## 2022-01-12 NOTE — Telephone Encounter (Signed)
Last office visit 05/04/2021 for headache.  Last refilled 12/06/2021 for #90 with no refills.  No future appointments.

## 2022-01-18 ENCOUNTER — Ambulatory Visit (INDEPENDENT_AMBULATORY_CARE_PROVIDER_SITE_OTHER): Payer: BC Managed Care – PPO | Admitting: Psychology

## 2022-01-18 ENCOUNTER — Encounter: Payer: Self-pay | Admitting: Psychology

## 2022-01-18 DIAGNOSIS — F908 Attention-deficit hyperactivity disorder, other type: Secondary | ICD-10-CM | POA: Diagnosis not present

## 2022-01-18 DIAGNOSIS — F411 Generalized anxiety disorder: Secondary | ICD-10-CM | POA: Diagnosis not present

## 2022-01-18 DIAGNOSIS — F330 Major depressive disorder, recurrent, mild: Secondary | ICD-10-CM

## 2022-01-18 NOTE — Progress Notes (Signed)
                Kapri Nero, PhD 
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
## 2022-01-18 NOTE — Progress Notes (Signed)
[facility] Counselor/Therapist Progress Note  Patient ID: Randall Barnett, MRN: [id number],    Date: 01/18/2022  Time Spent: 3-3:45 pm  Treatment Type:  Testing - Feedback Session  Met with patient to review results of testing.  Patient was at home and session was conducted from therapist's office via video conferencing.  Mother verbally consented to telehealth.       Reported Symptoms:  Patient complains of impaired attention, distractibility, poor organization, restlessness, and impulsivity since childhood, but not noticed due to other family members being highly explosive and violent. This has been impacting his work, especially the last three years.  He has a history of trauma, anxiety, and depressed mood.  Testing recommended to evaluate for ADHD as well as other conditions that may be affecting attention.    Subjective: Interactive feedback was conducted (1 hr.).  It was discussed how patient met the and ADHD along with how this condition affects his ability to attend, organize, and complete tasks.      Recommendations included discussing results with PCP, developing a visual organization system, and continuing individual counseling to help anxiety and depressed mood.  Patient expressed agreement with the results and recommendations.     Total Time of Testing: 5.5 hrs. Testing and Scoring: 2.5 hrs. Interactive Feedback:1 hr. Report Writing: 2 hrs.   Diagnosis:Other Specified Attention Deficit Hyperactivity Disorder - Childhood onset possibly influence by trauma. 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      Rainey Pines, PhD

## 2022-01-19 ENCOUNTER — Telehealth: Payer: Self-pay | Admitting: *Deleted

## 2022-01-19 ENCOUNTER — Encounter: Payer: Self-pay | Admitting: *Deleted

## 2022-01-19 NOTE — Telephone Encounter (Signed)
MyChart message sent to [facility] with below information from Dr. Ermalene Searing   Per his response appointment scheduled with Dr. Ermalene Searing on January 28, 2022 at 10:00 am to discuss ADD diagnosis and treatment options.   FYI to Dr. Ermalene Searing.

## 2022-01-19 NOTE — Telephone Encounter (Signed)
-----   Message from Excell Seltzer, MD sent at 01/19/2022 10:26 AM EDT ----- Contact patient.  After reviewing Dr. Allyne Gee evaluation he does have a diagnosis of ADD.  If he is interested in referral for counseling for this or for medication to treat please have him make an appointment with me, or I can place a referral to psychiatry if he would prefer that. ----- Message ----- From: Damita Lack, CMA Sent: 01/18/2022   4:07 PM EDT To: Excell Seltzer, MD   ----- Message ----- From: Jerene Bears Sent: 01/18/2022   4:02 PM EDT To: Damita Lack, CMA

## 2022-01-28 ENCOUNTER — Encounter: Payer: Self-pay | Admitting: Family Medicine

## 2022-01-28 ENCOUNTER — Ambulatory Visit (INDEPENDENT_AMBULATORY_CARE_PROVIDER_SITE_OTHER): Payer: BC Managed Care – PPO | Admitting: Family Medicine

## 2022-01-28 VITALS — BP 100/70 | HR 55 | Temp 98.8°F | Ht 69.0 in | Wt 244.4 lb

## 2022-01-28 DIAGNOSIS — F411 Generalized anxiety disorder: Secondary | ICD-10-CM

## 2022-01-28 DIAGNOSIS — H4921 Sixth [abducent] nerve palsy, right eye: Secondary | ICD-10-CM

## 2022-01-28 DIAGNOSIS — F988 Other specified behavioral and emotional disorders with onset usually occurring in childhood and adolescence: Secondary | ICD-10-CM | POA: Diagnosis not present

## 2022-01-28 DIAGNOSIS — R519 Headache, unspecified: Secondary | ICD-10-CM | POA: Diagnosis not present

## 2022-01-28 MED ORDER — DEXMETHYLPHENIDATE HCL ER 5 MG PO CP24: 5.0000 mg | ORAL_CAPSULE | Freq: Every day | ORAL | 0 refills | Status: DC

## 2022-01-28 NOTE — Assessment & Plan Note (Signed)
Resolved with treatment of CSF leak. 

## 2022-01-28 NOTE — Assessment & Plan Note (Signed)
Resolved with treatment of CSF leak.

## 2022-01-28 NOTE — Progress Notes (Signed)
Patient ID: Randall Barnett, male    DOB: 09-12-1980, 41 y.o.   MRN: [id number]  This visit was conducted in person.  BP 100/70   Pulse (!) 55   Temp 98.8 F (37.1 C) (Oral)   Ht 5\' 9"  (1.753 m)   Wt 244 lb 7 oz (110.9 kg)   SpO2 97%   BMI 36.10 kg/m    CC:  Chief Complaint  Patient presents with   ADD    Discuss ADD Dx    Subjective:   HPI: Randall Barnett is a 41 y.o. male presenting on 01/28/2022 for ADD (Discuss ADD Dx)     Had chronic headaches, CSF leak.  Had blood patch ... Now resolution of headache pain.  Reviewed ADHD testing in detail with patient.  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 He has also been dx with generalized anxiety and caution was recommended with stimulant medication to avoid increased anxiety. He is currently using Cymbalta 60 mg daily for anxiety and trazodone 50-100 mg qhs as needed.  He states that his anxiety is fairly well controlled... still some excessive worry when out of controlled environment.   We reviewed medication options to treat.   He is currently working on behavioral modification as instructed by Dr. 01/30/2022.  Relevant past medical, surgical, family and social history reviewed and updated as indicated. Interim medical history since our last visit reviewed. Allergies and medications reviewed and updated. Outpatient Medications Prior to Visit  Medication Sig Dispense Refill   DULoxetine (CYMBALTA) 60 MG capsule Take 1 capsule (60 mg total) by mouth daily. 90 capsule 1   ondansetron (ZOFRAN ODT) 4 MG disintegrating tablet Take 1 tablet (4 mg total) by mouth every 8 (eight) hours as needed for nausea or vomiting. 20 tablet 0   tiZANidine (ZANAFLEX) 4 MG tablet TAKE 1 TABLET BY MOUTH THREE TIMES A DAY 90 tablet 0   traZODone (DESYREL) 50 MG tablet TAKE 2 TABLETS EVERY NIGHT AS NEEDED FOR INSOMNIA 180 tablet 0   ALPRAZolam (XANAX) 0.5 MG tablet Take 0.5  mg by mouth 2 (two) times daily as needed.     benzonatate (TESSALON PERLES) 100 MG capsule Take 1 capsule (100 mg total) by mouth 3 (three) times daily as needed. 20 capsule 0   No facility-administered medications prior to visit.     Per HPI unless specifically indicated in ROS section below Review of Systems  Constitutional:  Negative for fatigue and fever.  HENT:  Negative for ear pain.   Eyes:  Negative for pain.  Respiratory:  Negative for cough and shortness of breath.   Cardiovascular:  Negative for chest pain, palpitations and leg swelling.  Gastrointestinal:  Negative for abdominal pain.  Genitourinary:  Negative for dysuria.  Musculoskeletal:  Negative for arthralgias.  Neurological:  Negative for syncope, light-headedness and headaches.  Psychiatric/Behavioral:  Negative for dysphoric mood.    Objective:  BP 100/70   Pulse (!) 55   Temp 98.8 F (37.1 C) (Oral)   Ht 5\' 9"  (1.753 m)   Wt 244 lb 7 oz (110.9 kg)   SpO2 97%   BMI 36.10 kg/m   Wt Readings from Last 3 Encounters:  01/28/22 244 lb 7 oz (110.9 kg)  05/12/21 264 lb 12.8 oz (120.1 kg)  05/04/21 257 lb 4 oz (116.7 kg)      Physical Exam Constitutional:      Appearance: He is well-developed.  HENT:  Head: Normocephalic.     Right Ear: Hearing normal.     Left Ear: Hearing normal.     Nose: Nose normal.  Neck:     Thyroid: No thyroid mass or thyromegaly.     Vascular: No carotid bruit.     Trachea: Trachea normal.  Cardiovascular:     Rate and Rhythm: Normal rate and regular rhythm.     Pulses: Normal pulses.     Heart sounds: Heart sounds not distant. No murmur heard.    No friction rub. No gallop.     Comments: No peripheral edema Pulmonary:     Effort: Pulmonary effort is normal. No respiratory distress.     Breath sounds: Normal breath sounds.  Skin:    General: Skin is warm and dry.     Findings: No rash.  Psychiatric:        Speech: Speech normal.        Behavior: Behavior normal.         Thought Content: Thought content normal.       Results for orders placed or performed in visit on 05/04/21  CBC with Differential/Platelet  Result Value Ref Range   WBC 7.3 4.0 - 10.5 K/uL   RBC 5.12 4.22 - 5.81 Mil/uL   Hemoglobin 14.5 13.0 - 17.0 g/dL   HCT 32.6 71.2 - 45.8 %   MCV 82.7 78.0 - 100.0 fl   MCHC 34.2 30.0 - 36.0 g/dL   RDW 09.9 83.3 - 82.5 %   Platelets 274.0 150.0 - 400.0 K/uL   Neutrophils Relative % 71.3 43.0 - 77.0 %   Lymphocytes Relative 21.9 12.0 - 46.0 %   Monocytes Relative 5.8 3.0 - 12.0 %   Eosinophils Relative 0.1 0.0 - 5.0 %   Basophils Relative 0.9 0.0 - 3.0 %   Neutro Abs 5.2 1.4 - 7.7 K/uL   Lymphs Abs 1.6 0.7 - 4.0 K/uL   Monocytes Absolute 0.4 0.1 - 1.0 K/uL   Eosinophils Absolute 0.0 0.0 - 0.7 K/uL   Basophils Absolute 0.1 0.0 - 0.1 K/uL  Sedimentation rate  Result Value Ref Range   Sed Rate 5 0 - 15 mm/hr  C-reactive protein  Result Value Ref Range   CRP <1.0 0.5 - 20.0 mg/dL  TSH  Result Value Ref Range   TSH 1.46 0.35 - 5.50 uIU/mL     COVID 19 screen:  No recent travel or known exposure to COVID19 The patient denies respiratory symptoms of COVID 19 at this time. The importance of social distancing was discussed today.   Assessment and Plan Problem List Items Addressed This Visit     6th nerve palsy    Resolved with treatment of CSF leak.      Relevant Medications   dexmethylphenidate (FOCALIN XR) 5 MG 24 hr capsule   Acute nonintractable headache    Resolved with treatment of CSF leak.      Attention deficit disorder (ADD) without hyperactivity - Primary    New diagnosis  Reviewed treatment options in detail with patient.  He is agreeable to referral for psychology for behavioral treatment.  Given his con commitment anxiety we will start a low-dose stimulant medication and increase slowly.  We will start Focalin ER 5 mg daily. He will follow-up in 4 weeks for reevaluation and potentially increase of medication.       Relevant Orders   Ambulatory referral to Psychology   GAD (generalized anxiety disorder)    Chronic, moderate control.  We discussed potentially changing his medication but he feels that it works fairly well.  We discussed that his anxiety may improve with improvement of his ADD but that we have to be careful with stimulant medication.  He will continue Cymbalta 60 mg p.o. daily and trazodone 50 to 100 mg at bedtime as needed.  He will discuss his anxiety at his psychology referral in addition to the ADD.      Relevant Orders   Ambulatory referral to Psychology       Kerby Nora, MD

## 2022-01-28 NOTE — Assessment & Plan Note (Signed)
New diagnosis  Reviewed treatment options in detail with patient.  He is agreeable to referral for psychology for behavioral treatment.  Given his con commitment anxiety we will start a low-dose stimulant medication and increase slowly.  We will start Focalin ER 5 mg daily. He will follow-up in 4 weeks for reevaluation and potentially increase of medication.

## 2022-01-28 NOTE — Assessment & Plan Note (Signed)
Chronic, moderate control.  We discussed potentially changing his medication but he feels that it works fairly well.  We discussed that his anxiety may improve with improvement of his ADD but that we have to be careful with stimulant medication.  He will continue Cymbalta 60 mg p.o. daily and trazodone 50 to 100 mg at bedtime as needed.  He will discuss his anxiety at his psychology referral in addition to the ADD.

## 2022-01-28 NOTE — Patient Instructions (Addendum)
Call to set up counselor.  Start low dose Focalin XR in morning.

## 2022-02-15 ENCOUNTER — Other Ambulatory Visit: Payer: Self-pay | Admitting: Family Medicine

## 2022-02-15 NOTE — Telephone Encounter (Signed)
Last office visit 01/28/22 for ADD.  Last refilled 01/14/22 for #90 with no refills.  Next appt: 02/25/2022 for follow up.

## 2022-02-21 ENCOUNTER — Other Ambulatory Visit: Payer: Self-pay | Admitting: Family Medicine

## 2022-02-25 ENCOUNTER — Ambulatory Visit (INDEPENDENT_AMBULATORY_CARE_PROVIDER_SITE_OTHER): Payer: BC Managed Care – PPO | Admitting: Family Medicine

## 2022-02-25 ENCOUNTER — Encounter: Payer: Self-pay | Admitting: Family Medicine

## 2022-02-25 VITALS — BP 120/80 | HR 92 | Temp 99.0°F | Ht 69.0 in | Wt 236.1 lb

## 2022-02-25 DIAGNOSIS — F411 Generalized anxiety disorder: Secondary | ICD-10-CM | POA: Diagnosis not present

## 2022-02-25 DIAGNOSIS — F988 Other specified behavioral and emotional disorders with onset usually occurring in childhood and adolescence: Secondary | ICD-10-CM

## 2022-02-25 MED ORDER — DEXMETHYLPHENIDATE HCL ER 5 MG PO CP24
5.0000 mg | ORAL_CAPSULE | Freq: Every day | ORAL | 0 refills | Status: DC
Start: 2022-02-25 — End: 2022-02-25

## 2022-02-25 MED ORDER — DEXMETHYLPHENIDATE HCL ER 5 MG PO CP24: 5.0000 mg | ORAL_CAPSULE | Freq: Every day | ORAL | 0 refills | Status: DC

## 2022-02-25 NOTE — Assessment & Plan Note (Signed)
Chronic, moderate control  Worsened today in office given he lost his job prior to the appointment today.  We discussed relaxation techniques and stress reduction.  We discussed his step-by-step plan for return to the workforce and methods to avoid becoming overwhelmed.  Continue Cymbalta 60 mg p.o. daily and trazodone 50 mg 2 tablets at night as needed for insomnia.

## 2022-02-25 NOTE — Patient Instructions (Signed)
Continue Focalin XR 5 mg daily.

## 2022-02-25 NOTE — Progress Notes (Signed)
Patient ID: Randall Barnett, male    DOB: [birth year], 41 y.o.   MRN: [id number]  This visit was conducted in person.  BP 120/80   Pulse 92   Temp 99 F (37.2 C) (Oral)   Ht 5\' 9"  (1.753 m)   Wt 236 lb 2 oz (107.1 kg)   SpO2 96%   BMI 34.87 kg/m    CC:  Chief Complaint  Patient presents with   Follow-up    Focalin start    Subjective:   HPI: Randall Barnett is a 41 y.o. male presenting on 02/25/2022 for Follow-up (Focalin start)  ADD At last office visit 01/28/2022 he was started on initial dose of Focalin ER at 5 mg daily. Compliant with meds: Yes benefit from med (ie increase in concentration): He has noted some improvement in focus, has been more productive. 50% improved. change in mood:  slight increase in anxiety but able to manage it better. change in appetite: good, healthy foods. More physically active. Insomnia: none tremor: none compliant with behavioral modification:trying  simplify tasks. Frequent break  No heart racing.  PDMP reviewed during this encounter.   Unfortunately today he is feeling anxious BP Readings from Last 3 Encounters:  02/25/22 120/80  01/28/22 100/70  05/12/21 129/71   Wt Readings from Last 3 Encounters:  02/25/22 236 lb 2 oz (107.1 kg)  01/28/22 244 lb 7 oz (110.9 kg)  05/12/21 264 lb 12.8 oz (120.1 kg)   Body mass index is 34.87 kg/m.       Relevant past medical, surgical, family and social history reviewed and updated as indicated. Interim medical history since our last visit reviewed. Allergies and medications reviewed and updated. Outpatient Medications Prior to Visit  Medication Sig Dispense Refill   dexmethylphenidate (FOCALIN XR) 5 MG 24 hr capsule Take 1 capsule (5 mg total) by mouth daily. 30 capsule 0   DULoxetine (CYMBALTA) 60 MG capsule TAKE 1 CAPSULE BY MOUTH EVERY DAY 90 capsule 1   ondansetron (ZOFRAN ODT) 4 MG disintegrating tablet Take 1 tablet (4 mg total) by mouth every 8 (eight) hours as needed for  nausea or vomiting. 20 tablet 0   tiZANidine (ZANAFLEX) 4 MG tablet TAKE 1 TABLET BY MOUTH THREE TIMES A DAY 90 tablet 0   traZODone (DESYREL) 50 MG tablet TAKE 2 TABLETS EVERY NIGHT AS NEEDED FOR INSOMNIA 180 tablet 0   No facility-administered medications prior to visit.     Per HPI unless specifically indicated in ROS section below Review of Systems  Constitutional:  Negative for fatigue and fever.  HENT:  Negative for ear pain.   Eyes:  Negative for pain.  Respiratory:  Negative for cough and shortness of breath.   Cardiovascular:  Negative for chest pain, palpitations and leg swelling.  Gastrointestinal:  Negative for abdominal pain.  Genitourinary:  Negative for dysuria.  Musculoskeletal:  Negative for arthralgias.  Neurological:  Negative for syncope, light-headedness and headaches.  Psychiatric/Behavioral:  Negative for dysphoric mood.    Objective:  BP 120/80   Pulse 92   Temp 99 F (37.2 C) (Oral)   Ht 5\' 9"  (1.753 m)   Wt 236 lb 2 oz (107.1 kg)   SpO2 96%   BMI 34.87 kg/m   Wt Readings from Last 3 Encounters:  02/25/22 236 lb 2 oz (107.1 kg)  01/28/22 244 lb 7 oz (110.9 kg)  05/12/21 264 lb 12.8 oz (120.1 kg)      Physical Exam Constitutional:  Appearance: He is well-developed.  HENT:     Head: Normocephalic.     Right Ear: Hearing normal.     Left Ear: Hearing normal.     Nose: Nose normal.  Neck:     Thyroid: No thyroid mass or thyromegaly.     Vascular: No carotid bruit.     Trachea: Trachea normal.  Cardiovascular:     Rate and Rhythm: Normal rate and regular rhythm.     Pulses: Normal pulses.     Heart sounds: Heart sounds not distant. No murmur heard.    No friction rub. No gallop.     Comments: No peripheral edema Pulmonary:     Effort: Pulmonary effort is normal. No respiratory distress.     Breath sounds: Normal breath sounds.  Skin:    General: Skin is warm and dry.     Findings: No rash.  Psychiatric:        Speech: Speech normal.         Behavior: Behavior normal.        Thought Content: Thought content normal.       Results for orders placed or performed in visit on 05/04/21  CBC with Differential/Platelet  Result Value Ref Range   WBC 7.3 4.0 - 10.5 K/uL   RBC 5.12 4.22 - 5.81 Mil/uL   Hemoglobin 14.5 13.0 - 17.0 g/dL   HCT 16.0 10.9 - 32.3 %   MCV 82.7 78.0 - 100.0 fl   MCHC 34.2 30.0 - 36.0 g/dL   RDW 55.7 32.2 - 02.5 %   Platelets 274.0 150.0 - 400.0 K/uL   Neutrophils Relative % 71.3 43.0 - 77.0 %   Lymphocytes Relative 21.9 12.0 - 46.0 %   Monocytes Relative 5.8 3.0 - 12.0 %   Eosinophils Relative 0.1 0.0 - 5.0 %   Basophils Relative 0.9 0.0 - 3.0 %   Neutro Abs 5.2 1.4 - 7.7 K/uL   Lymphs Abs 1.6 0.7 - 4.0 K/uL   Monocytes Absolute 0.4 0.1 - 1.0 K/uL   Eosinophils Absolute 0.0 0.0 - 0.7 K/uL   Basophils Absolute 0.1 0.0 - 0.1 K/uL  Sedimentation rate  Result Value Ref Range   Sed Rate 5 0 - 15 mm/hr  C-reactive protein  Result Value Ref Range   CRP <1.0 0.5 - 20.0 mg/dL  TSH  Result Value Ref Range   TSH 1.46 0.35 - 5.50 uIU/mL     COVID 19 screen:  No recent travel or known exposure to COVID19 The patient denies respiratory symptoms of COVID 19 at this time. The importance of social distancing was discussed today.   Assessment and Plan    Problem List Items Addressed This Visit     Attention deficit disorder (ADD) without hyperactivity - Primary    Chronic, improved  He has noted 50% improvement with addition of Focalin ER 5 mg p.o. daily. He is potentially had some minor increase in his anxiety but states that he feels like he can handle it even better now with the improvement in his focus and concentration.  We will not increase the medication at this point but will instead monitor side effects and follow-up in 3 months.      GAD (generalized anxiety disorder)    Chronic, moderate control  Worsened today in office given he lost his job prior to the appointment today.  We  discussed relaxation techniques and stress reduction.  We discussed his step-by-step plan for return to the workforce and methods  to avoid becoming overwhelmed.  Continue Cymbalta 60 mg p.o. daily and trazodone 50 mg 2 tablets at night as needed for insomnia.      Meds ordered this encounter  Medications   DISCONTD: dexmethylphenidate (FOCALIN XR) 5 MG 24 hr capsule    Sig: Take 1 capsule (5 mg total) by mouth daily.    Dispense:  30 capsule    Refill:  0   DISCONTD: dexmethylphenidate (FOCALIN XR) 5 MG 24 hr capsule    Sig: Take 1 capsule (5 mg total) by mouth daily. Fill after 03/28/2022    Dispense:  30 capsule    Refill:  0   dexmethylphenidate (FOCALIN XR) 5 MG 24 hr capsule    Sig: Take 1 capsule (5 mg total) by mouth daily. Fill after 04/28/2022    Dispense:  30 capsule    Refill:  0     Eliezer Lofts, MD

## 2022-02-25 NOTE — Assessment & Plan Note (Signed)
Chronic, improved  He has noted 50% improvement with addition of Focalin ER 5 mg p.o. daily. He is potentially had some minor increase in his anxiety but states that he feels like he can handle it even better now with the improvement in his focus and concentration.  We will not increase the medication at this point but will instead monitor side effects and follow-up in 3 months.

## 2022-03-18 ENCOUNTER — Other Ambulatory Visit: Payer: Self-pay | Admitting: Family Medicine

## 2022-03-18 NOTE — Telephone Encounter (Signed)
Last refill: 02/16/2022 Last OV 02-25-22 No Future OV [facility]

## 2022-04-01 ENCOUNTER — Other Ambulatory Visit: Payer: Self-pay | Admitting: Family Medicine

## 2022-04-01 NOTE — Telephone Encounter (Signed)
Last office visit  02/25/22 for ADD and GAD.  Last refilled 03/18/12 for #90 with no refills.  Pharmacy is asking for 90 day supply #270.  No future appointments.

## 2022-04-16 ENCOUNTER — Other Ambulatory Visit: Payer: Self-pay | Admitting: Family Medicine

## 2022-08-23 ENCOUNTER — Other Ambulatory Visit: Payer: Self-pay | Admitting: Family Medicine

## 2022-08-23 MED ORDER — TRAZODONE HCL 50 MG PO TABS: ORAL_TABLET | ORAL | 0 refills | Status: DC

## 2022-08-23 MED ORDER — TIZANIDINE HCL 4 MG PO TABS
4.0000 mg | ORAL_TABLET | Freq: Three times a day (TID) | ORAL | 0 refills | Status: DC
Start: 2022-08-23 — End: 2023-01-26

## 2022-08-23 NOTE — Telephone Encounter (Signed)
Last office visit 02/25/22 for ADD and GAD.  Last refilled 04/01/2022 for #270 with no refills.  No future appointments.  Per last AVS patient was to follow up around 05/28/22 for ADD.  Refill?

## 2022-08-24 MED ORDER — TRAZODONE HCL 50 MG PO TABS: ORAL_TABLET | ORAL | 0 refills | Status: DC

## 2022-08-24 NOTE — Addendum Note (Signed)
Addended by: Carter Kitten on: 08/24/2022 08:49 AM   Modules accepted: Orders

## 2022-11-03 ENCOUNTER — Telehealth: Payer: Medicaid Other | Admitting: Urgent Care

## 2022-11-03 DIAGNOSIS — H60392 Other infective otitis externa, left ear: Secondary | ICD-10-CM | POA: Diagnosis not present

## 2022-11-03 DIAGNOSIS — R112 Nausea with vomiting, unspecified: Secondary | ICD-10-CM

## 2022-11-03 MED ORDER — ONDANSETRON 4 MG PO TBDP: 4.0000 mg | ORAL_TABLET | Freq: Three times a day (TID) | ORAL | 0 refills | Status: DC | PRN

## 2022-11-03 MED ORDER — MUPIROCIN 2 % EX OINT: 1.0000 | TOPICAL_OINTMENT | Freq: Two times a day (BID) | CUTANEOUS | 0 refills | Status: DC

## 2022-11-03 NOTE — Patient Instructions (Addendum)
Ronald Pippins, thank you for joining Chaney Malling, PA for today's virtual visit.  While this provider is not your primary care provider (PCP), if your PCP is located in our provider database this encounter information will be shared with them immediately following your visit.   [facility] account gives you access to today's visit and all your visits, tests, and labs performed at [facility] " click here if you don't have a [facility] account or go to [email or web address]  Consent: (Patient) Ashaan Palmese provided verbal consent for this virtual visit at the beginning of the encounter.  Current Medications:  Current Outpatient Medications:    mupirocin ointment (BACTROBAN) 2 %, Apply 1 Application topically 2 (two) times daily., Disp: 22 g, Rfl: 0   ondansetron (ZOFRAN-ODT) 4 MG disintegrating tablet, Take 1 tablet (4 mg total) by mouth every 8 (eight) hours as needed for nausea or vomiting., Disp: 20 tablet, Rfl: 0   dexmethylphenidate (FOCALIN XR) 5 MG 24 hr capsule, Take 1 capsule (5 mg total) by mouth daily. Fill after 04/28/2022, Disp: 30 capsule, Rfl: 0   DULoxetine (CYMBALTA) 60 MG capsule, TAKE 1 CAPSULE BY MOUTH EVERY DAY, Disp: 90 capsule, Rfl: 1   tiZANidine (ZANAFLEX) 4 MG tablet, Take 1 tablet (4 mg total) by mouth 3 (three) times daily., Disp: 270 tablet, Rfl: 0   traZODone (DESYREL) 50 MG tablet, TAKE 2 TABLETS EVERY NIGHT AS NEEDED FOR INSOMNIA, Disp: 180 tablet, Rfl: 0   Medications ordered in this encounter:  Meds ordered this encounter  Medications   ondansetron (ZOFRAN-ODT) 4 MG disintegrating tablet    Sig: Take 1 tablet (4 mg total) by mouth every 8 (eight) hours as needed for nausea or vomiting.    Dispense:  20 tablet    Refill:  0    Order Specific Question:   Supervising Provider    Answer:   Chase Picket [id number]   mupirocin ointment (BACTROBAN) 2 %    Sig: Apply 1 Application topically 2 (two) times daily.     Dispense:  22 g    Refill:  0    Order Specific Question:   Supervising Provider    Answer:   Chase Picket [id number]     *If you need refills on other medications prior to your next appointment, please contact your pharmacy*  Follow-Up: Call back or seek an in-person evaluation if the symptoms worsen or if the condition fails to improve as anticipated.  [facility] [phone]  Other Instructions Place zofran under your tongue; dissolve one tab every 8 hours as needed for nausea and vomiting. Take small sips of water, pedialyte, gatorade until tolerating. Eat BLAND foods - bananas, rice, apples, toast, boiled chicken, saltine crackers. Avoid spicy or greasy foods.  Monitor for signs of dehydration.  Place topical mupirocin ointment on the affected area of the L ear three times daily until resolved. Do not place ear buds in ear until resolved.    If you have been instructed to have an in-person evaluation today at a local Urgent Care facility, please use the link below. It will take you to a list of all of our available [facility], including address, phone number and hours of operation. Please do not delay care.  [facility]  If you or a family member do not have a primary care provider, use the link below to schedule a visit and establish care. When you choose a  [facility] primary care physician or advanced practice provider, you gain a long-term partner in health. Find a Primary Care Provider  Learn more about 's in-office and virtual care options: [facility]

## 2022-11-03 NOTE — Progress Notes (Signed)
Virtual Visit Consent   Randall Barnett, you are scheduled for a virtual visit with a [facility] provider today. Just as with appointments in the office, your consent must be obtained to participate. Your consent will be active for this visit and any virtual visit you may have with one of our providers in the next 365 days. If you have a MyChart account, a copy of this consent can be sent to you electronically.  As this is a virtual visit, video technology does not allow for your provider to perform a traditional examination. This may limit your provider's ability to fully assess your condition. If your provider identifies any concerns that need to be evaluated in person or the need to arrange testing (such as labs, EKG, etc.), we will make arrangements to do so. Although advances in technology are sophisticated, we cannot ensure that it will always work on either your end or our end. If the connection with a video visit is poor, the visit may have to be switched to a telephone visit. With either a video or telephone visit, we are not always able to ensure that we have a secure connection.  By engaging in this virtual visit, you consent to the provision of healthcare and authorize for your insurance to be billed (if applicable) for the services provided during this visit. Depending on your insurance coverage, you may receive a charge related to this service.  I need to obtain your verbal consent now. Are you willing to proceed with your visit today? Iori Caracappa has provided verbal consent on 11/03/2022 for a virtual visit (video or telephone). Chaney Malling, PA  Date: 11/03/2022 7:34 PM  Virtual Visit via Video Note   I, [place], connected with  Ayzen Dondiego  ([id number], [birth year]) on 11/03/22 at  6:30 PM EDT by a video-enabled telemedicine application and verified that I am speaking with the correct person using two identifiers.  Location: Patient: Virtual Visit Location Patient:  Home Provider: Virtual Visit Location Provider: Home Office   I discussed the limitations of evaluation and management by telemedicine and the availability of in person appointments. The patient expressed understanding and agreed to proceed.    History of Present Illness: Randall Barnett is a 42 y.o. who identifies as a male who was assigned male at birth, and is being seen today for ear pain and N/V.  HPI: 42yo male with known hx of gastric surgery presents today with N/V. Woke up with nausea this morning, started vomiting in the shower. 1.5 hours of dry heaving this morning. States due to his gastric surgery, his vomiting is usually pretty forceful. States he pulled a muscle in back from it. Is sitting on the heating pad now. Has tizanidine at home if needed, but is concerned as he is only able to tolerate small sips of water at this time due to continued nausea. No abdominal pain or fever. No profuse diarrhea.  Additionally, pt states he has been having a several day hx of L ear pain. Has an app on his phone to visualize the ear and states the canal has a "pus bump" on it. Hurts to touch and feels like it is crusting. Attributes this to ear buds he wore earlier this week. Is able to feel it with the tip of his finger. Feels like the lymph nodes around his ear are also swollen. No pain deep in ear, no hearing loss, no fever.   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   Acute nonintractable headache 04/23/2021   GAD (generalized anxiety disorder) 09/17/2020   Panic attacks 09/17/2020   Obesity, Class II, BMI 35-39.9, no comorbidity 09/17/2020   Chronic bilateral low back pain with bilateral sciatica 09/17/2020   Attention deficit disorder (ADD) without hyperactivity 09/17/2020    Allergies:  Allergies  Allergen Reactions   Vicodin Hp [Hydrocodone-Acetaminophen] Other (See Comments)    Itchy Throat   Medications:  Current Outpatient  Medications:    mupirocin ointment (BACTROBAN) 2 %, Apply 1 Application topically 2 (two) times daily., Disp: 22 g, Rfl: 0   ondansetron (ZOFRAN-ODT) 4 MG disintegrating tablet, Take 1 tablet (4 mg total) by mouth every 8 (eight) hours as needed for nausea or vomiting., Disp: 20 tablet, Rfl: 0   dexmethylphenidate (FOCALIN XR) 5 MG 24 hr capsule, Take 1 capsule (5 mg total) by mouth daily. Fill after 04/28/2022, Disp: 30 capsule, Rfl: 0   DULoxetine (CYMBALTA) 60 MG capsule, TAKE 1 CAPSULE BY MOUTH EVERY DAY, Disp: 90 capsule, Rfl: 1   tiZANidine (ZANAFLEX) 4 MG tablet, Take 1 tablet (4 mg total) by mouth 3 (three) times daily., Disp: 270 tablet, Rfl: 0   traZODone (DESYREL) 50 MG tablet, TAKE 2 TABLETS EVERY NIGHT AS NEEDED FOR INSOMNIA, Disp: 180 tablet, Rfl: 0  Observations/Objective: Patient is well-developed, well-nourished in no acute distress.  Resting comfortably in bed at home. Non-toxic Head is normocephalic, atraumatic.  No labored breathing.  Speech is clear and coherent with logical content.  Patient is alert and oriented at baseline.  Pain with movement of tragus.  Assessment and Plan: 1. Nausea and vomiting, unspecified vomiting type  2. Infection of skin of ear lobe, left  Nausea and vomiting likely related to viral GI illness. Denies profuse diarrhea or symptoms of dehydration. Will do ODT zofran for PRN use. BRAT diet.   L ear tragus has what sounds like a pustule. Sx not consistent with OE as pt can palpate the bump. Will start topical mupirocin.  Follow Up Instructions: I discussed the assessment and treatment plan with the patient. The patient was provided an opportunity to ask questions and all were answered. The patient agreed with the plan and demonstrated an understanding of the instructions.  A copy of instructions were sent to the patient via MyChart unless otherwise noted below.    The patient was advised to call back or seek an in-person evaluation if the  symptoms worsen or if the condition fails to improve as anticipated.  Time:  I spent 9 minutes with the patient via telehealth technology discussing the above problems/concerns.    [place], PA

## 2022-11-21 ENCOUNTER — Other Ambulatory Visit: Payer: Self-pay | Admitting: Family Medicine

## 2022-11-21 NOTE — Telephone Encounter (Signed)
Duplicate request

## 2022-11-21 NOTE — Telephone Encounter (Signed)
Last office visit 02/25/22 for ADD.  Last refilled 02/25/2022 for #30 with no refills x 3 months.  No future appointments.  

## 2022-11-21 NOTE — Telephone Encounter (Signed)
Last office visit 02/25/22 for ADD.  Last refilled 02/25/2022 for #30 with no refills x 3 months.  No future appointments.

## 2022-11-22 NOTE — Telephone Encounter (Signed)
Please call and schedule follow up appointment on his ADD with Dr. Ermalene Searing.

## 2022-11-22 NOTE — Telephone Encounter (Signed)
Over due for follow up

## 2022-11-23 NOTE — Telephone Encounter (Signed)
LVM for pt to cb and sch.  

## 2022-11-24 ENCOUNTER — Ambulatory Visit: Payer: Medicaid Other | Admitting: Family Medicine

## 2022-11-24 ENCOUNTER — Encounter: Payer: Self-pay | Admitting: Family Medicine

## 2022-11-24 VITALS — BP 100/64 | HR 62 | Temp 98.3°F | Ht 69.0 in | Wt 258.1 lb

## 2022-11-24 DIAGNOSIS — F988 Other specified behavioral and emotional disorders with onset usually occurring in childhood and adolescence: Secondary | ICD-10-CM | POA: Diagnosis not present

## 2022-11-24 DIAGNOSIS — F411 Generalized anxiety disorder: Secondary | ICD-10-CM

## 2022-11-24 DIAGNOSIS — F320 Major depressive disorder, single episode, mild: Secondary | ICD-10-CM

## 2022-11-24 MED ORDER — DULOXETINE HCL 60 MG PO CPEP: 60.0000 mg | ORAL_CAPSULE | Freq: Every day | ORAL | 1 refills | Status: DC

## 2022-11-24 NOTE — Assessment & Plan Note (Signed)
Chronic, not currently taking Focalin 5 mg daily given working night shifts and in the job that he does not have issues focusing.  He will reconsider restarting this if his job situation changes.

## 2022-11-24 NOTE — Progress Notes (Signed)
Patient ID: Karla Vines, male    DOB: April 01, 1981, 42 y.o.   MRN: [id number]  This visit was conducted in person.  BP 100/64   Pulse 62   Temp 98.3 F (36.8 C) (Temporal)   Ht  (1.753 m)   Wt 258 lb 2 oz (117.1 kg)   SpO2 97%   BMI 38.12 kg/m    CC:  Chief Complaint  Patient presents with   ADD    Subjective:   HPI: Danney Bungert is a 42 y.o. male presenting on 11/24/2022 for ADD   GAD/MDD: significant improvement with cymbalta 60 mg daily. Using trazodone at  sleep time.    11/24/2022   10:57 AM 02/25/2022    9:51 AM 09/17/2020    4:24 PM  GAD 7 : Generalized Anxiety Score  Nervous, Anxious, on Edge Control/stop worrying Worry too much - different things 0 1 3  Trouble relaxing 0 1 2  Restless 0 1 1  Easily annoyed or irritable 0 2 1  Afraid - awful might happen 1 3 0  Total GAD 7 Score Anxiety Difficulty Not difficult at all Very difficult Very difficult       11/24/2022   10:56 AM 02/25/2022    9:51 AM 09/17/2020    4:24 PM  Depression screen PHQ 2/9  Decreased Interest Down, Depressed, Hopeless 0 2 0  PHQ - 2 Score Altered sleeping Tired, decreased energy Change in appetite 0 2 1  Feeling bad or failure about yourself  1 3 0  Trouble concentrating 0 1 0  Moving slowly or fidgety/restless 0 1 0  Suicidal thoughts 0 0 1  PHQ-9 Score Difficult doing work/chores Not difficult at all Somewhat difficult Somewhat difficult      ADD Compliant with meds:  No currently taking given working nights and able to focus.. was doing well on  Focalin ER at 5 mg daily. benefit from med  when was taking it: yes . change in mood:   see above change in appetite: good, healthy foods. More physically active. Insomnia: none tremor: none compliant with behavioral modification:trying  simplify tasks. Frequent break  No heart racing.   BP Readings from Last 3 Encounters:  11/24/22 100/64  02/25/22  120/80  01/28/22 100/70   Wt Readings from Last 3 Encounters:  11/24/22 258 lb 2 oz (117.1 kg)  02/25/22 236 lb 2 oz (107.1 kg)  01/28/22 244 lb 7 oz (110.9 kg)   Body mass index is 38.12 kg/m.       Relevant past medical, surgical, family and social history reviewed and updated as indicated. Interim medical history since our last visit reviewed. Allergies and medications reviewed and updated. Outpatient Medications Prior to Visit  Medication Sig Dispense Refill   ondansetron (ZOFRAN-ODT) 4 MG disintegrating tablet Take 1 tablet (4 mg total) by mouth every 8 (eight) hours as needed for nausea or vomiting. 20 tablet 0   tiZANidine (ZANAFLEX) 4 MG tablet Take 1 tablet (4 mg total) by mouth 3 (three) times daily. 270 tablet 0   traZODone (DESYREL) 50 MG tablet TAKE 2 TABLETS EVERY NIGHT AS NEEDED FOR INSOMNIA 180 tablet 0   DULoxetine (CYMBALTA) 60 MG capsule TAKE 1 CAPSULE BY MOUTH EVERY DAY 90 capsule 1   dexmethylphenidate (FOCALIN XR) 5 MG 24  hr capsule Take 1 capsule (5 mg total) by mouth daily. Fill after 04/28/2022 (Patient not taking: Reported on 11/24/2022) 30 capsule 0   mupirocin ointment (BACTROBAN) 2 % Apply 1 Application topically 2 (two) times daily. 22 g 0   No facility-administered medications prior to visit.     Per HPI unless specifically indicated in ROS section below Review of Systems  Constitutional:  Negative for fatigue and fever.  HENT:  Negative for ear pain.   Eyes:  Negative for pain.  Respiratory:  Negative for cough and shortness of breath.   Cardiovascular:  Negative for chest pain, palpitations and leg swelling.  Gastrointestinal:  Negative for abdominal pain.  Genitourinary:  Negative for dysuria.  Musculoskeletal:  Negative for arthralgias.  Neurological:  Negative for syncope, light-headedness and headaches.  Psychiatric/Behavioral:  Negative for dysphoric mood.    Objective:  BP 100/64   Pulse 62   Temp 98.3 F (36.8 C) (Temporal)   Ht 5'  9" (1.753 m)   Wt 258 lb 2 oz (117.1 kg)   SpO2 97%   BMI 38.12 kg/m   Wt Readings from Last 3 Encounters:  11/24/22 258 lb 2 oz (117.1 kg)  02/25/22 236 lb 2 oz (107.1 kg)  01/28/22 244 lb 7 oz (110.9 kg)      Physical Exam Constitutional:      Appearance: He is well-developed.  HENT:     Head: Normocephalic.     Right Ear: Hearing normal.     Left Ear: Hearing normal.     Nose: Nose normal.  Neck:     Thyroid: No thyroid mass or thyromegaly.     Vascular: No carotid bruit.     Trachea: Trachea normal.  Cardiovascular:     Rate and Rhythm: Normal rate and regular rhythm.     Pulses: Normal pulses.     Heart sounds: Heart sounds not distant. No murmur heard.    No friction rub. No gallop.     Comments: No peripheral edema Pulmonary:     Effort: Pulmonary effort is normal. No respiratory distress.     Breath sounds: Normal breath sounds.  Skin:    General: Skin is warm and dry.     Findings: No rash.  Psychiatric:        Speech: Speech normal.        Behavior: Behavior normal.        Thought Content: Thought content normal.       Results for orders placed or performed in visit on 05/04/21  CBC with Differential/Platelet  Result Value Ref Range   WBC 7.3 4.0 - 10.5 K/uL   RBC 5.12 4.22 - 5.81 Mil/uL   Hemoglobin 14.5 13.0 - 17.0 g/dL   HCT 53.6 64.4 - 03.4 %   MCV 82.7 78.0 - 100.0 fl   MCHC 34.2 30.0 - 36.0 g/dL   RDW 74.2 59.5 - 63.8 %   Platelets 274.0 150.0 - 400.0 K/uL   Neutrophils Relative % 71.3 43.0 - 77.0 %   Lymphocytes Relative 21.9 12.0 - 46.0 %   Monocytes Relative 5.8 3.0 - 12.0 %   Eosinophils Relative 0.1 0.0 - 5.0 %   Basophils Relative 0.9 0.0 - 3.0 %   Neutro Abs 5.2 1.4 - 7.7 K/uL   Lymphs Abs 1.6 0.7 - 4.0 K/uL   Monocytes Absolute 0.4 0.1 - 1.0 K/uL   Eosinophils Absolute 0.0 0.0 - 0.7 K/uL   Basophils Absolute 0.1 0.0 - 0.1 K/uL  Sedimentation rate  Result Value Ref Range   Sed Rate 5 0 - 15 mm/hr  C-reactive protein  Result  Value Ref Range   CRP <1.0 0.5 - 20.0 mg/dL  TSH  Result Value Ref Range   TSH 1.46 0.35 - 5.50 uIU/mL     COVID 19 screen:  No recent travel or known exposure to COVID19 The patient denies respiratory symptoms of COVID 19 at this time. The importance of social distancing was discussed today.   Assessment and Plan    Problem List Items Addressed This Visit     Attention deficit disorder (ADD) without hyperactivity    Chronic, not currently taking Focalin 5 mg daily given working night shifts and in the job that he does not have issues focusing.  He will reconsider restarting this if his job situation changes.      Current mild episode of major depressive disorder without prior episode    Stable, chronic.  Continue current medication.  Cymbalta 60 mg p.o. daily Trazodone 50 -100 mg p.o. at bedtime      Relevant Medications   DULoxetine (CYMBALTA) 60 MG capsule   GAD (generalized anxiety disorder) - Primary    Chronic, improved control  Continue Cymbalta 60 mg p.o. daily and trazodone 50 mg 2 tablets at night as needed for insomnia.      Relevant Medications   DULoxetine (CYMBALTA) 60 MG capsule   Meds ordered this encounter  Medications   DULoxetine (CYMBALTA) 60 MG capsule    Sig: Take 1 capsule (60 mg total) by mouth daily.    Dispense:  90 capsule    Refill:  1     Kerby Nora, MD

## 2022-11-24 NOTE — Assessment & Plan Note (Signed)
Chronic, improved control  Continue Cymbalta 60 mg p.o. daily and trazodone 50 mg 2 tablets at night as needed for insomnia.

## 2022-11-24 NOTE — Assessment & Plan Note (Signed)
Stable, chronic.  Continue current medication.  Cymbalta 60 mg p.o. daily Trazodone 50 -100 mg p.o. at bedtime

## 2023-01-04 ENCOUNTER — Emergency Department: Payer: Medicaid Other

## 2023-01-04 ENCOUNTER — Telehealth: Payer: Medicaid Other

## 2023-01-04 ENCOUNTER — Other Ambulatory Visit: Payer: Self-pay

## 2023-01-04 ENCOUNTER — Emergency Department
Admission: EM | Admit: 2023-01-04 | Discharge: 2023-01-04 | Disposition: A | Discharge status: Home / Self Care | Payer: Medicaid Other | Attending: Emergency Medicine | Admitting: Emergency Medicine

## 2023-01-04 ENCOUNTER — Encounter: Payer: Self-pay | Admitting: Nurse Practitioner

## 2023-01-04 ENCOUNTER — Telehealth: Payer: Medicaid Other | Admitting: Nurse Practitioner

## 2023-01-04 DIAGNOSIS — R300 Dysuria: Secondary | ICD-10-CM | POA: Diagnosis not present

## 2023-01-04 DIAGNOSIS — N132 Hydronephrosis with renal and ureteral calculous obstruction: Secondary | ICD-10-CM | POA: Diagnosis not present

## 2023-01-04 DIAGNOSIS — R319 Hematuria, unspecified: Secondary | ICD-10-CM

## 2023-01-04 DIAGNOSIS — R109 Unspecified abdominal pain: Secondary | ICD-10-CM

## 2023-01-04 DIAGNOSIS — N200 Calculus of kidney: Secondary | ICD-10-CM | POA: Diagnosis not present

## 2023-01-04 LAB — CBC
HCT: 43.7 % (ref 39.0–52.0)
Hemoglobin: 15.3 g/dL (ref 13.0–17.0)
MCH: 27.9 pg (ref 26.0–34.0)
MCHC: 35 g/dL (ref 30.0–36.0)
MCV: 79.7 fL — ABNORMAL LOW (ref 80.0–100.0)
Platelets: 270 10*3/uL (ref 150–400)
RBC: 5.48 MIL/uL (ref 4.22–5.81)
RDW: 12.7 % (ref 11.5–15.5)
WBC: 9.5 10*3/uL (ref 4.0–10.5)
nRBC: 0 % (ref 0.0–0.2)

## 2023-01-04 LAB — BASIC METABOLIC PANEL
Anion gap: 10 (ref 5–15)
BUN: 21 mg/dL — ABNORMAL HIGH (ref 6–20)
CO2: 20 mmol/L — ABNORMAL LOW (ref 22–32)
Calcium: 9.1 mg/dL (ref 8.9–10.3)
Chloride: 106 mmol/L (ref 98–111)
Creatinine, Ser: 1.05 mg/dL (ref 0.61–1.24)
GFR, Estimated: >60 mL/min (ref >60)
Glucose, Bld: 141 mg/dL — ABNORMAL HIGH (ref 70–99)
Potassium: 3.6 mmol/L (ref 3.5–5.1)
Sodium: 136 mmol/L (ref 135–145)

## 2023-01-04 LAB — URINALYSIS, ROUTINE W REFLEX MICROSCOPIC
Bilirubin Urine: NEGATIVE
Glucose, UA: NEGATIVE mg/dL
Ketones, ur: 5 mg/dL — AB
Leukocytes,Ua: NEGATIVE
Nitrite: NEGATIVE
Protein, ur: 100 mg/dL — AB
Specific Gravity, Urine: 1.03 (ref 1.005–1.030)
pH: 5 (ref 5.0–8.0)

## 2023-01-04 MED ORDER — TAMSULOSIN HCL 0.4 MG PO CAPS: 0.4000 mg | ORAL_CAPSULE | Freq: Every day | ORAL | 0 refills | Status: AC

## 2023-01-04 MED ORDER — ONDANSETRON HCL 4 MG/2ML IJ SOLN
4.0000 mg | Freq: Once | INTRAMUSCULAR | Status: AC
  Administered 2023-01-04: 4 mg via INTRAVENOUS
  Filled 2023-01-04: qty 2

## 2023-01-04 MED ORDER — DEXAMETHASONE SODIUM PHOSPHATE 10 MG/ML IJ SOLN
10.0000 mg | Freq: Once | INTRAMUSCULAR | Status: AC
  Administered 2023-01-04: 10 mg via INTRAVENOUS
  Filled 2023-01-04: qty 1

## 2023-01-04 MED ORDER — TAMSULOSIN HCL 0.4 MG PO CAPS
0.4000 mg | ORAL_CAPSULE | Freq: Once | ORAL | Status: AC
  Administered 2023-01-04: 0.4 mg via ORAL
  Filled 2023-01-04: qty 1

## 2023-01-04 MED ORDER — OXYCODONE HCL 5 MG PO TABS: 5.0000 mg | ORAL_TABLET | Freq: Four times a day (QID) | ORAL | 0 refills | Status: DC | PRN

## 2023-01-04 MED ORDER — OXYCODONE HCL 5 MG PO TABS
5.0000 mg | ORAL_TABLET | Freq: Once | ORAL | Status: AC
  Administered 2023-01-04: 5 mg via ORAL
  Filled 2023-01-04: qty 1

## 2023-01-04 MED ORDER — HYDROMORPHONE HCL 1 MG/ML IJ SOLN
1.0000 mg | Freq: Once | INTRAMUSCULAR | Status: AC
  Administered 2023-01-04: 1 mg via INTRAVENOUS
  Filled 2023-01-04: qty 1

## 2023-01-04 MED ORDER — ONDANSETRON 4 MG PO TBDP: 4.0000 mg | ORAL_TABLET | Freq: Three times a day (TID) | ORAL | 0 refills | Status: DC | PRN

## 2023-01-04 MED ORDER — SODIUM CHLORIDE 0.9 % IV BOLUS
1000.0000 mL | Freq: Once | INTRAVENOUS | Status: AC
  Administered 2023-01-04: 1000 mL via INTRAVENOUS

## 2023-01-04 MED ORDER — KETOROLAC TROMETHAMINE 15 MG/ML IJ SOLN
15.0000 mg | Freq: Once | INTRAMUSCULAR | Status: AC
  Administered 2023-01-04: 15 mg via INTRAVENOUS
  Filled 2023-01-04: qty 1

## 2023-01-04 NOTE — Progress Notes (Signed)
Virtual Visit Consent   Randall Barnett, you are scheduled for a virtual visit with a [facility] provider today. Just as with appointments in the office, your consent must be obtained to participate. Your consent will be active for this visit and any virtual visit you may have with one of our providers in the next 365 days. If you have a MyChart account, a copy of this consent can be sent to you electronically.  As this is a virtual visit, video technology does not allow for your provider to perform a traditional examination. This may limit your provider's ability to fully assess your condition. If your provider identifies any concerns that need to be evaluated in person or the need to arrange testing (such as labs, EKG, etc.), we will make arrangements to do so. Although advances in technology are sophisticated, we cannot ensure that it will always work on either your end or our end. If the connection with a video visit is poor, the visit may have to be switched to a telephone visit. With either a video or telephone visit, we are not always able to ensure that we have a secure connection.  By engaging in this virtual visit, you consent to the provision of healthcare and authorize for your insurance to be billed (if applicable) for the services provided during this visit. Depending on your insurance coverage, you may receive a charge related to this service.  I need to obtain your verbal consent now. Are you willing to proceed with your visit today? Randall Barnett has provided verbal consent on 01/04/2023 for a virtual visit (video or telephone). Randall Simas, FNP  Date: 01/04/2023 4:54 PM  Virtual Visit via Video Note   I, Randall Barnett, connected with  Randall Barnett  ([id number], April 04, 1981) on 01/04/23 at  5:00 PM EDT by a video-enabled telemedicine application and verified that I am speaking with the correct person using two identifiers.  Location: Patient: Virtual Visit Location Patient:  Home Provider: Virtual Visit Location Provider: Home Office   I discussed the limitations of evaluation and management by telemedicine and the availability of in person appointments. The patient expressed understanding and agreed to proceed.    History of Present Illness: Randall Barnett is a 42 y.o. who identifies as a male who was assigned male at birth, and is being seen today for pain associated with urination  He has slight discomfort with urinating prior to bed He awoke in the middle of sleeping with severe pain on the left side that radiates from his back to his abdomen.   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 Fill after 04/28/2022 (Patient not taking: Reported on 11/24/2022), Disp: 30 capsule, Rfl: 0   DULoxetine (CYMBALTA) 60 MG capsule, Take 1 capsule (60 mg total) by mouth daily., Disp: 90 capsule, Rfl: 1   ondansetron  (ZOFRAN-ODT) 4 MG disintegrating tablet, Take 1 tablet (4 mg total) by mouth every 8 (eight) hours as needed for nausea or vomiting., Disp: 20 tablet, Rfl: 0   tiZANidine (ZANAFLEX) 4 MG tablet, Take 1 tablet (4 mg total) by mouth 3 (three) times daily., Disp: 270 tablet, Rfl: 0   traZODone (DESYREL) 50 MG tablet, TAKE 2 TABLETS EVERY NIGHT AS NEEDED FOR INSOMNIA, Disp: 180 tablet, Rfl: 0  Observations/Objective: Patient is well-developed, well-nourished in no acute distress.  Resting comfortably  at home.  Head is normocephalic, atraumatic.  No labored breathing.  Speech is clear and coherent with logical content.  Patient is alert and oriented at baseline.    Assessment and Plan: 1. Dysuria Symptoms align with likely kidney stone  Advised pushing fluids  Tylenol as needed for pain relief   ED for unrelieved pain, inability to void or worsening symptoms as discussed       Follow Up Instructions: I discussed the assessment and treatment plan with the patient. The patient was provided an opportunity to ask questions and all were answered. The patient agreed with the plan and demonstrated an understanding of the instructions.  A copy of instructions were sent to the patient via MyChart unless otherwise noted below.    The patient was advised to call back or seek an in-person evaluation if the symptoms worsen or if the condition fails to improve as anticipated.  Time:  I spent 15 minutes with the patient via telehealth technology discussing the above problems/concerns.    Randall Simas, FNP

## 2023-01-04 NOTE — ED Provider Notes (Signed)
[place] EMERGENCY DEPARTMENT AT [facility] Provider Note   CSN: [id number] Arrival date & time: 01/04/23  1801     History  Chief Complaint  Patient presents with   Groin Pain   Flank Pain    Mani Ruckert is a 42 y.o. male presents to the emergency department valuation of left groin and flank pain.  Symptoms began earlier today at work.  He developed some nausea with pain radiating into his left flank down into the groin.  He also noticed some changes with urination.  He is describes a burning sensation rating down the left flank.  No fevers chills.  Mild nausea without vomiting.  He is urinating well at this time.  No history of kidney stones.  No other past medical history.  He has not had medications for pain.  His pain is very severe and he is uncomfortable.  Pain becomes 10 out of 10 with movement  HPI     Home Medications Prior to Admission medications   Medication Sig Start Date End Date Taking? Authorizing Provider  ondansetron (ZOFRAN-ODT) 4 MG disintegrating tablet Take 1 tablet (4 mg total) by mouth every 8 (eight) hours as needed for nausea or vomiting. 01/04/23  Yes Evon Slack, PA-C  oxyCODONE (ROXICODONE) 5 MG immediate release tablet Take 1 tablet (5 mg total) by mouth every 6 (six) hours as needed. 01/04/23 01/04/24 Yes Evon Slack, PA-C  tamsulosin (FLOMAX) 0.4 MG CAPS capsule Take 1 capsule (0.4 mg total) by mouth daily after supper for 3 days. 01/04/23 01/07/23 Yes Evon Slack, PA-C  dexmethylphenidate (FOCALIN XR) 5 MG 24 hr capsule Take 1 capsule (5 mg total) by mouth daily. Fill after 04/28/2022 Patient not taking: Reported on 11/24/2022 02/25/22   Excell Seltzer, MD  DULoxetine (CYMBALTA) 60 MG capsule Take 1 capsule (60 mg total) by mouth daily. 11/24/22   Bedsole, Amy E, MD  tiZANidine (ZANAFLEX) 4 MG tablet Take 1 tablet (4 mg total) by mouth 3 (three) times daily. 08/23/22   Bedsole, Amy E, MD  traZODone (DESYREL) 50 MG tablet TAKE 2 TABLETS  EVERY NIGHT AS NEEDED FOR INSOMNIA 08/24/22   Bedsole, Amy E, MD      Allergies    Vicodin hp [hydrocodone-acetaminophen]    Review of Systems   Review of Systems  Physical Exam Updated Vital Signs BP (!) 159/104   Pulse 65   Temp 97.9 F (36.6 C)   Resp 18   Ht 5\' 10"  (1.778 m)   Wt 111.1 kg   SpO2 98%   BMI 35.15 kg/m  Physical Exam Constitutional:      Appearance: He is well-developed.     Comments: Patient very uncomfortable.  Writhing in pain.  HENT:     Head: Normocephalic and atraumatic.  Eyes:     Conjunctiva/sclera: Conjunctivae normal.  Cardiovascular:     Rate and Rhythm: Normal rate.  Pulmonary:     Effort: Pulmonary effort is normal. No respiratory distress.  Abdominal:     General: There is no distension.     Palpations: Abdomen is soft. There is no mass.     Tenderness: There is no abdominal tenderness. There is left CVA tenderness. There is no right CVA tenderness.  Musculoskeletal:        General: Normal range of motion.     Cervical back: Normal range of motion.  Skin:    General: Skin is warm.     Findings: No rash.  Neurological:  General: No focal deficit present.     Mental Status: He is alert and oriented to person, place, and time.  Psychiatric:        Behavior: Behavior normal.        Thought Content: Thought content normal.     ED Results / Procedures / Treatments   Labs (all labs ordered are listed, but only abnormal results are displayed) Labs Reviewed  URINALYSIS, ROUTINE W REFLEX MICROSCOPIC - Abnormal; Notable for the following components:      Result Value   Color, Urine YELLOW (*)    APPearance HAZY (*)    Hgb urine dipstick LARGE (*)    Ketones, ur 5 (*)    Protein, ur 100 (*)    Bacteria, UA RARE (*)    All other components within normal limits  CBC - Abnormal; Notable for the following components:   MCV 79.7 (*)    All other components within normal limits  BASIC METABOLIC PANEL - Abnormal; Notable for the  following components:   CO2 20 (*)    Glucose, Bld 141 (*)    BUN 21 (*)    All other components within normal limits    EKG None  Radiology CT Renal Stone Study  Result Date: 01/04/2023 CLINICAL DATA:  Left-sided flank pain for several hours, initial encounter EXAM: CT ABDOMEN AND PELVIS WITHOUT CONTRAST TECHNIQUE: Multidetector CT imaging of the abdomen and pelvis was performed following the standard protocol without IV contrast. RADIATION DOSE REDUCTION: This exam was performed according to the departmental dose-optimization program which includes automated exposure control, adjustment of the mA and/or kV according to patient size and/or use of iterative reconstruction technique. COMPARISON:  None Available. FINDINGS: Lower chest: No acute abnormality. Hepatobiliary: No focal liver abnormality is seen. No gallstones, gallbladder wall thickening, or biliary dilatation. Pancreas: Unremarkable. No pancreatic ductal dilatation or surrounding inflammatory changes. Spleen: Normal in size without focal abnormality. Adrenals/Urinary Tract: Adrenal glands are within normal limits. Right kidney shows no renal calculi or obstructive changes. Left kidney demonstrates mild hydronephrosis and hydroureter secondary to a punctate 1 mm stone in the distal left ureter just above the UVJ. The bladder is decompressed. Stomach/Bowel: The appendix is within normal limits. No obstructive or inflammatory changes of colon are seen. 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 Small bowel is within normal limits. Vascular/Lymphatic: No significant vascular findings are present. No enlarged abdominal or pelvic lymph nodes. Reproductive: Prostate is unremarkable. Other: No abdominal wall hernia or abnormality. No abdominopelvic ascites. Musculoskeletal: No acute or significant osseous findings. IMPRESSION: 1 mm distal left ureteral stone with hydronephrosis and hydroureter. No other focal  abnormality is noted. Electronically Signed   By: Alcide Clever M.D.   On: 01/04/2023 19:21    Procedures Procedures    Medications Ordered in ED Medications  oxyCODONE (Oxy IR/ROXICODONE) immediate release tablet 5 mg (has no administration in time range)  tamsulosin (FLOMAX) capsule 0.4 mg (has no administration in time range)  sodium chloride 0.9 % bolus 1,000 mL (0 mLs Intravenous Stopped 01/04/23 2037)  HYDROmorphone (DILAUDID) injection 1 mg (1 mg Intravenous Given 01/04/23 1941)  ondansetron (ZOFRAN) injection 4 mg (4 mg Intravenous Given 01/04/23 1941)  ketorolac (TORADOL) 15 MG/ML injection 15 mg (15 mg Intravenous Given 01/04/23 2037)  dexamethasone (DECADRON) injection 10 mg (10 mg Intravenous Given 01/04/23 2037)    ED Course/ Medical Decision Making/ A&P  Medical Decision Making Amount and/or Complexity of Data Reviewed Labs: ordered. Radiology: ordered.  Risk Prescription drug management.  42 year old male with left sided flank pain.  History and exam findings consistent with possible kidney stone.  CT scan confirmed 1 mm left distal ureteral stone.  Patient's pain controlled with IV medications, fluids.  He is very comfortable at this time.  Vital signs are stable.  CBC and BMP within normal limits.  Urinalysis with some mild blood present, no signs of any infectious process in the urine.  Patient educated on follow-up with urology.Marland Kitchen  He will be sent home with oxycodone, Zofran, Flomax.  He understands signs symptoms return to the ER for.  Final Clinical Impression(s) / ED Diagnoses Final diagnoses:  Left flank pain  Hematuria, unspecified type  Kidney stone    Rx / DC Orders ED Discharge Orders          Ordered    oxyCODONE (ROXICODONE) 5 MG immediate release tablet  Every 6 hours PRN        01/04/23 2136    tamsulosin (FLOMAX) 0.4 MG CAPS capsule  Daily after supper        01/04/23 2136    ondansetron (ZOFRAN-ODT) 4 MG disintegrating  tablet  Every 8 hours PRN        01/04/23 2136              Ronnette Juniper 01/04/23 2143    Georga Hacking, MD 01/05/23 2348

## 2023-01-04 NOTE — ED Triage Notes (Signed)
Pt to ED for left flank pain radiating to left groin started today. +nausea.  +difficulty urinating, states feels like has to urinate and only a little comes out.  Took zofran PTA

## 2023-01-04 NOTE — Discharge Instructions (Signed)
Please make sure you are drinking lots of fluids.  Take pain medication and nausea medication as prescribed as needed.  You may use Tylenol and ibuprofen for additional pain relief.  Return to the ER for any fevers increasing pain nausea vomiting worsening symptoms or any urgent changes in health

## 2023-01-05 ENCOUNTER — Telehealth: Payer: Self-pay

## 2023-01-05 ENCOUNTER — Other Ambulatory Visit: Payer: Self-pay | Admitting: Family Medicine

## 2023-01-05 MED ORDER — OXYCODONE HCL 5 MG PO TABS: 5.0000 mg | ORAL_TABLET | Freq: Two times a day (BID) | ORAL | 0 refills | Status: AC | PRN

## 2023-01-05 NOTE — Telephone Encounter (Signed)
Please let patient know I have sent in this prescription as requested given the prescription the emergency room provided had to be sent to a different pharmacy.  I have sent in a small amount so if he continues to require pain medication he needs to be seen in the office for assessment.

## 2023-01-05 NOTE — Telephone Encounter (Signed)
Patient returned transitions of care call, would lie a call back when possible. Please advise [phone], thank you.

## 2023-01-05 NOTE — Telephone Encounter (Signed)
Patient contacted the office regarding a prescription oxyCODONE (ROXICODONE) 5 MG immediate release tablet , states he was given this at an ED visit last night. Went to pick up at [facility] and was told they do not have it there, told patient to contact pcp to have it sent elsewhere. Patient was calling to see if Ermalene Searing could send this to a pharmacy to be filled? Stated the pharmacist told him [facility] on [address] in [place] has it in Regulatory affairs officer. Please advise, thank you.

## 2023-01-05 NOTE — Transitions of Care (Post Inpatient/ED Visit) (Signed)
   01/05/2023  Name: Randall Barnett MRN: [id number] DOB: Feb 19, 1981  Today's TOC FU Call Status: Today's TOC FU Call Status:: Successful TOC FU Call Competed TOC FU Call Complete Date: 01/05/23  Transition Care Management Follow-up Telephone Call Date of Discharge: 01/04/23 Discharge Facility: [facility]) Type of Discharge: Emergency Department Reason for ED Visit: Other: How have you been since you were released from the hospital?: Better Any questions or concerns?: Yes Patient Questions/Concerns:: Patient is having issues with getting his Pain medication filled (see phone) Patient Questions/Concerns Addressed: Notified Provider of Patient Questions/Concerns  Items Reviewed: Did you receive and understand the discharge instructions provided?: Yes Medications obtained,verified, and reconciled?: Yes (Medications Reviewed) Any new allergies since your discharge?: No Dietary orders reviewed?: NA Do you have support at home?: Yes People in Home: friend(s)  Medications Reviewed Today: Medications Reviewed Today     Reviewed by Annabell Sabal, CMA (Certified Medical Assistant) on 01/05/23 at 1512  Med List Status: <None>   Medication Order Taking? Sig Documenting Provider Last Dose Status Informant  dexmethylphenidate (FOCALIN XR) 5 MG 24 hr capsule [id number] Yes Take 1 capsule (5 mg total) by mouth daily. Fill after 04/28/2022 Excell Seltzer, MD Taking Active   DULoxetine (CYMBALTA) 60 MG capsule [id number] Yes Take 1 capsule (60 mg total) by mouth daily. Excell Seltzer, MD Taking Active   ondansetron (ZOFRAN-ODT) 4 MG disintegrating tablet [id number] Yes Take 1 tablet (4 mg total) by mouth every 8 (eight) hours as needed for nausea or vomiting. Evon Slack, PA-C Taking Active   oxyCODONE (ROXICODONE) 5 MG immediate release tablet [id number] Yes Take 1 tablet (5 mg total) by mouth every 6 (six) hours as needed. Evon Slack, PA-C Taking Active    tamsulosin [facility]) 0.4 MG CAPS capsule [id number] Yes Take 1 capsule (0.4 mg total) by mouth daily after supper for 3 days. Evon Slack, PA-C Taking Active   tiZANidine (ZANAFLEX) 4 MG tablet [id number] Yes Take 1 tablet (4 mg total) by mouth 3 (three) times daily. Excell Seltzer, MD Taking Active   traZODone (DESYREL) 50 MG tablet [id number] Yes TAKE 2 TABLETS EVERY NIGHT AS NEEDED FOR INSOMNIA Bedsole, Amy E, MD Taking Active             Home Care and Equipment/Supplies: Were Home Health Services Ordered?: No Any new equipment or medical supplies ordered?: No  Functional Questionnaire: Do you need assistance with bathing/showering or dressing?: No Do you need assistance with meal preparation?: No Do you need assistance with eating?: No Do you have difficulty maintaining continence: No Do you need assistance with getting out of bed/getting out of a chair/moving?: No Do you have difficulty managing or taking your medications?: No  Follow up appointments reviewed: PCP Follow-up appointment confirmed?: No MD Provider Line [phone] Given: Yes Specialist Hospital Follow-up appointment confirmed?: NA Do you need transportation to your follow-up appointment?: No Do you understand care options if your condition(s) worsen?: Yes-patient verbalized understanding    SIGNATURE Fredirick Maudlin

## 2023-01-05 NOTE — Telephone Encounter (Signed)
No

## 2023-01-06 NOTE — Telephone Encounter (Signed)
Spoke with pt relaying Dr. Daphine Deutscher message. Pt verbalizes understanding and expresses his thanks.

## 2023-01-25 ENCOUNTER — Other Ambulatory Visit: Payer: Self-pay | Admitting: Family Medicine

## 2023-01-25 NOTE — Telephone Encounter (Signed)
Last office visit 11/24/22 for GAD, Depression and ADD.  Last refilled 08/23/2022 for #270 with no refills.  Next Appt:  No future appointments.

## 2023-05-05 ENCOUNTER — Telehealth: Payer: Medicaid Other | Admitting: Physician Assistant

## 2023-05-05 DIAGNOSIS — A09 Infectious gastroenteritis and colitis, unspecified: Secondary | ICD-10-CM | POA: Diagnosis not present

## 2023-05-06 MED ORDER — ONDANSETRON HCL 4 MG PO TABS: 4.0000 mg | ORAL_TABLET | Freq: Three times a day (TID) | ORAL | 0 refills | Status: AC | PRN

## 2023-05-06 NOTE — Progress Notes (Signed)
E-Visit for Diarrhea  We are sorry that you are not feeling well.  Here is how we plan to help!  Based on what you have shared with me it looks like you have Acute Infectious Diarrhea.  Most cases of acute diarrhea are due to infections with virus and bacteria and are self-limited conditions lasting less than 14 days.  For your symptoms you may take Imodium 2 mg tablets that are over the counter at your local pharmacy. Take two tablet now and then one after each loose stool up to 6 a day.  Antibiotics are not needed for most people with diarrhea.  Optional: Zofran 4 mg 1 tablet every 8 hours as needed for nausea and vomitingsent to pharmacy on file   HOME CARE We recommend changing your diet to help with your symptoms for the next few days. Drink plenty of fluids that contain water salt and sugar. Sports drinks such as Gatorade may help.  You may try broths, soups, bananas, applesauce, soft breads, mashed potatoes or crackers.  You are considered infectious for as long as the diarrhea continues. Hand washing or use of alcohol based hand sanitizers is recommend. It is best to stay out of work or school until your symptoms stop.   GET HELP RIGHT AWAY If you have dark yellow colored urine or do not pass urine frequently you should drink more fluids.   If your symptoms worsen  If you feel like you are going to pass out (faint) You have a new problem  MAKE SURE YOU  Understand these instructions. Will watch your condition. Will get help right away if you are not doing well or get worse.  Thank you for choosing an e-visit.  Your e-visit answers were reviewed by a board certified advanced clinical practitioner to complete your personal care plan. Depending upon the condition, your plan could have included both over the counter or prescription medications.  Please review your pharmacy choice. Make sure the pharmacy is open so you can pick up prescription now. If there is a problem, you  may contact your provider through [employer] and have the prescription routed to another pharmacy.  Your safety is important to [place]. If you have drug allergies check your prescription carefully.   For the next 24 hours you can use MyChart to ask questions about today's visit, request a non-urgent call back, or ask for a work or school excuse. You will get an email in the next two days asking about your experience. I hope that your e-visit has been valuable and will speed your recovery.  I have spent 5 minutes in review of e-visit questionnaire, review and updating patient chart, medical decision making and response to patient.   Reed Pandy, PA-C

## 2023-05-11 ENCOUNTER — Other Ambulatory Visit: Payer: Self-pay | Admitting: Family Medicine

## 2023-05-11 NOTE — Telephone Encounter (Signed)
LAST APPOINTMENT DATE: 11/24/22   NEXT APPOINTMENT DATE: Visit date not found    LAST REFILL: 08/24/22  QTY: #180 no rf

## 2023-05-23 ENCOUNTER — Other Ambulatory Visit: Payer: Self-pay | Admitting: Family Medicine

## 2023-05-24 NOTE — Telephone Encounter (Signed)
Last office visit 11/24/22 for GAD, Depression and ADD.  Last refilled 01/26/23 for #270 with no refills.  Next Appt:  No future appointments.

## 2023-08-11 ENCOUNTER — Other Ambulatory Visit: Payer: Self-pay | Admitting: Family Medicine

## 2023-08-15 ENCOUNTER — Telehealth: Payer: Medicaid Other

## 2023-08-15 DIAGNOSIS — A084 Viral intestinal infection, unspecified: Secondary | ICD-10-CM

## 2023-08-16 MED ORDER — ONDANSETRON 4 MG PO TBDP
4.0000 mg | ORAL_TABLET | Freq: Three times a day (TID) | ORAL | 0 refills | Status: AC | PRN
Start: 2023-08-16 — End: ?

## 2023-08-16 NOTE — Progress Notes (Signed)

## 2023-09-12 ENCOUNTER — Other Ambulatory Visit: Payer: Self-pay | Admitting: Family Medicine

## 2023-09-12 NOTE — Telephone Encounter (Signed)
Last office visit 11/24/2022 for Depression,GAD, ADD.  Last refilled 05/24/2023 for #90 with 2 refills.  Next Appt: No future appointments.

## 2023-09-28 ENCOUNTER — Other Ambulatory Visit: Payer: Self-pay | Admitting: Family Medicine

## 2023-09-28 NOTE — Telephone Encounter (Signed)
 Please call and schedule CPE with fasting labs prior with Dr. Ermalene Searing.  Please send back to me once scheduled to refill medication.

## 2023-09-28 NOTE — Telephone Encounter (Signed)
 LVM for patient to c/b and schedule.

## 2023-09-29 NOTE — Telephone Encounter (Signed)
 LVM for patient to cb and schedule. Sent mychart message as well.

## 2023-09-29 NOTE — Telephone Encounter (Signed)
 Patient has been scheduled

## 2023-10-03 ENCOUNTER — Ambulatory Visit (INDEPENDENT_AMBULATORY_CARE_PROVIDER_SITE_OTHER): Payer: Medicaid Other | Admitting: Family Medicine

## 2023-10-03 VITALS — BP 118/60 | HR 68 | Temp 98.1°F | Ht 69.5 in | Wt 245.0 lb

## 2023-10-03 DIAGNOSIS — E66812 Obesity, class 2: Secondary | ICD-10-CM

## 2023-10-03 DIAGNOSIS — F320 Major depressive disorder, single episode, mild: Secondary | ICD-10-CM

## 2023-10-03 DIAGNOSIS — R5383 Other fatigue: Secondary | ICD-10-CM | POA: Insufficient documentation

## 2023-10-03 DIAGNOSIS — F411 Generalized anxiety disorder: Secondary | ICD-10-CM

## 2023-10-03 DIAGNOSIS — Z9884 Bariatric surgery status: Secondary | ICD-10-CM | POA: Diagnosis not present

## 2023-10-03 DIAGNOSIS — Z0000 Encounter for general adult medical examination without abnormal findings: Secondary | ICD-10-CM

## 2023-10-03 DIAGNOSIS — Z13220 Encounter for screening for lipoid disorders: Secondary | ICD-10-CM

## 2023-10-03 DIAGNOSIS — F988 Other specified behavioral and emotional disorders with onset usually occurring in childhood and adolescence: Secondary | ICD-10-CM

## 2023-10-03 DIAGNOSIS — R197 Diarrhea, unspecified: Secondary | ICD-10-CM | POA: Insufficient documentation

## 2023-10-03 LAB — COMPREHENSIVE METABOLIC PANEL
ALT: 51 U/L (ref 0–53)
AST: 22 U/L (ref 0–37)
Albumin: 4.8 g/dL (ref 3.5–5.2)
Alkaline Phosphatase: 66 U/L (ref 39–117)
BUN: 10 mg/dL (ref 6–23)
CO2: 28 meq/L (ref 19–32)
Calcium: 9.6 mg/dL (ref 8.4–10.5)
Chloride: 103 meq/L (ref 96–112)
Creatinine, Ser: 0.78 mg/dL (ref 0.40–1.50)
GFR: 111.2 mL/min (ref >60.00)
Glucose, Bld: 91 mg/dL (ref 70–99)
Potassium: 3.7 meq/L (ref 3.5–5.1)
Sodium: 138 meq/L (ref 135–145)
Total Bilirubin: 0.7 mg/dL (ref 0.2–1.2)
Total Protein: 7.4 g/dL (ref 6.0–8.3)

## 2023-10-03 LAB — CBC WITH DIFFERENTIAL/PLATELET
Basophils Absolute: 0 10*3/uL (ref 0.0–0.1)
Basophils Relative: 0.8 % (ref 0.0–3.0)
Eosinophils Absolute: 0.1 10*3/uL (ref 0.0–0.7)
Eosinophils Relative: 1.4 % (ref 0.0–5.0)
HCT: 44.8 % (ref 39.0–52.0)
Hemoglobin: 15.3 g/dL (ref 13.0–17.0)
Lymphocytes Relative: 22.9 % (ref 12.0–46.0)
Lymphs Abs: 1.4 10*3/uL (ref 0.7–4.0)
MCHC: 34.1 g/dL (ref 30.0–36.0)
MCV: 82.8 fl (ref 78.0–100.0)
Monocytes Absolute: 0.5 10*3/uL (ref 0.1–1.0)
Monocytes Relative: 7.5 % (ref 3.0–12.0)
Neutro Abs: 4.2 10*3/uL (ref 1.4–7.7)
Neutrophils Relative %: 67.4 % (ref 43.0–77.0)
Platelets: 257 10*3/uL (ref 150.0–400.0)
RBC: 5.41 Mil/uL (ref 4.22–5.81)
RDW: 14.2 % (ref 11.5–15.5)
WBC: 6.2 10*3/uL (ref 4.0–10.5)

## 2023-10-03 LAB — IBC + FERRITIN
Ferritin: 16.4 ng/mL — ABNORMAL LOW (ref 22.0–322.0)
Iron: 54 ug/dL (ref 42–165)
Saturation Ratios: 14.5 % — ABNORMAL LOW (ref 20.0–50.0)
TIBC: 372.4 ug/dL (ref 250.0–450.0)
Transferrin: 266 mg/dL (ref 212.0–360.0)

## 2023-10-03 LAB — LIPID PANEL
Cholesterol: 222 mg/dL — ABNORMAL HIGH (ref 0–200)
HDL: 61.7 mg/dL (ref >39.00)
LDL Cholesterol: 147 mg/dL — ABNORMAL HIGH (ref 0–99)
NonHDL: 160.31
Total CHOL/HDL Ratio: 4
Triglycerides: 69 mg/dL (ref 0.0–149.0)
VLDL: 13.8 mg/dL (ref 0.0–40.0)

## 2023-10-03 LAB — T3, FREE: T3, Free: 8.8 pg/mL — ABNORMAL HIGH (ref 2.3–4.2)

## 2023-10-03 LAB — VITAMIN D 25 HYDROXY (VIT D DEFICIENCY, FRACTURES): VITD: 27.26 ng/mL — ABNORMAL LOW (ref 30.00–100.00)

## 2023-10-03 LAB — TSH: TSH: 1.91 u[IU]/mL (ref 0.35–5.50)

## 2023-10-03 LAB — T4, FREE: Free T4: 1.6 ng/dL (ref 0.60–1.60)

## 2023-10-03 LAB — VITAMIN B12: Vitamin B-12: 311 pg/mL (ref 211–911)

## 2023-10-03 MED ORDER — DULOXETINE HCL 30 MG PO CPEP: ORAL_CAPSULE | ORAL | 1 refills | Status: AC

## 2023-10-03 NOTE — Assessment & Plan Note (Signed)
Chronic, not currently taking Focalin 5 mg daily given working night shifts and in the job that he does not have issues focusing.  He will reconsider restarting this if his job situation changes.

## 2023-10-03 NOTE — Addendum Note (Signed)
 Addended by: Vincenza Hews on: 10/03/2023 12:05 PM   Modules accepted: Orders

## 2023-10-03 NOTE — Progress Notes (Addendum)
 Patient ID: Randall Barnett, male    DOB: 1981-03-14, 43 y.o.   MRN: [id number]  This visit was conducted in person.  BP 118/60 (BP Location: Left Arm, Patient Position: Sitting, Cuff Size: Large)   Pulse 68   Temp 98.1 F (36.7 C) (Temporal)   Ht 5' 9.5" (1.765 m)   Wt 245 lb (111.1 kg)   SpO2 98%   BMI 35.66 kg/m    CC:  Chief Complaint  Patient presents with   Annual Exam    Subjective:   HPI: Randall Barnett is a 43 y.o. male presenting on 10/03/2023 for Annual Exam  The patient presents for  complete physical and review of chronic health problems. He/She also has the following acute concerns today:   Having issues with recurrent nausea, diarrhea spells once a month  Occ blood in stool... on outside of stool... notes after lots of diarrhea. May be  History of bariatric surgery.   Due for lab re-eval.   GAD/MDD: Inadequate control on  cymbalta 60 mg daily. Using trazodone at  sleep time.  Has had trouble with keeping a job in last year... feeling like nothing going well for him.  Difficult interactions at current work.     10/03/2023   11:09 AM 11/24/2022   10:57 AM 02/25/2022    9:51 AM 09/17/2020    4:24 PM  GAD 7 : Generalized Anxiety Score  Nervous, Anxious, on Edge 1 1 1 3   Control/stop worrying 1 1 2 2   Worry too much - different things 1 0 1 3  Trouble relaxing 1 0 1 2  Restless 1 0 1 1  Easily annoyed or irritable 1 0 2 1  Afraid - awful might happen 1 1 3  0  Total GAD 7 Score 7 3 11 12   Anxiety Difficulty Somewhat difficult Not difficult at all Very difficult Very difficult       10/03/2023   11:09 AM 11/24/2022   10:56 AM 02/25/2022    9:51 AM  Depression screen PHQ 2/9  Decreased Interest 1 1 2   Down, Depressed, Hopeless 1 0 2  PHQ - 2 Score 2 1 4   Altered sleeping 1 1 2   Tired, decreased energy 1 1 2   Change in appetite 1 0 2  Feeling bad or failure about yourself  1 1 3   Trouble concentrating 0 0 1  Moving slowly or fidgety/restless 0 0 1   Suicidal thoughts 0 0 0  PHQ-9 Score 6 4 15   Difficult doing work/chores Somewhat difficult Not difficult at all Somewhat difficult      ADD Compliant with meds:  No currently taking given working nights and able to focus.. was doing well on  Focalin ER at 5 mg daily. benefit from med  when was taking it: yes .   BP Readings from Last 3 Encounters:  10/03/23 118/60  01/04/23 (!) 159/104  11/24/22 100/64   Wt Readings from Last 3 Encounters:  10/03/23 245 lb (111.1 kg)  01/04/23 245 lb (111.1 kg)  11/24/22 258 lb 2 oz (117.1 kg)   Body mass index is 35.66 kg/m.       Relevant past medical, surgical, family and social history reviewed and updated as indicated. Interim medical history since our last visit reviewed. Allergies and medications reviewed and updated. Outpatient Medications Prior to Visit  Medication Sig Dispense Refill   DULoxetine (CYMBALTA) 60 MG capsule TAKE 1 CAPSULE BY MOUTH EVERY DAY 30 capsule 0   ondansetron (  ZOFRAN-ODT) 4 MG disintegrating tablet Take 1 tablet (4 mg total) by mouth every 8 (eight) hours as needed. 20 tablet 0   tiZANidine (ZANAFLEX) 4 MG tablet TAKE 1 TABLET BY MOUTH THREE TIMES A DAY 90 tablet 2   traZODone (DESYREL) 50 MG tablet TAKE 2 TABLETS EVERY NIGHT AS NEEDED FOR INSOMNIA 180 tablet 0   dexmethylphenidate (FOCALIN XR) 5 MG 24 hr capsule Take 1 capsule (5 mg total) by mouth daily. Fill after 04/28/2022 30 capsule 0   No facility-administered medications prior to visit.     Per HPI unless specifically indicated in ROS section below Review of Systems  Constitutional:  Negative for fatigue and fever.  HENT:  Negative for ear pain.   Eyes:  Negative for pain.  Respiratory:  Negative for cough and shortness of breath.   Cardiovascular:  Negative for chest pain, palpitations and leg swelling.  Gastrointestinal:  Negative for abdominal pain.  Genitourinary:  Negative for dysuria.  Musculoskeletal:  Negative for arthralgias.   Neurological:  Negative for syncope, light-headedness and headaches.  Psychiatric/Behavioral:  Negative for dysphoric mood.    Objective:  BP 118/60 (BP Location: Left Arm, Patient Position: Sitting, Cuff Size: Large)   Pulse 68   Temp 98.1 F (36.7 C) (Temporal)   Ht 5' 9.5" (1.765 m)   Wt 245 lb (111.1 kg)   SpO2 98%   BMI 35.66 kg/m   Wt Readings from Last 3 Encounters:  10/03/23 245 lb (111.1 kg)  01/04/23 245 lb (111.1 kg)  11/24/22 258 lb 2 oz (117.1 kg)      Physical Exam Constitutional:      Appearance: He is well-developed. He is obese.  HENT:     Head: Normocephalic.     Right Ear: Hearing normal.     Left Ear: Hearing normal.     Nose: Nose normal.  Neck:     Thyroid: No thyroid mass or thyromegaly.     Vascular: No carotid bruit.     Trachea: Trachea normal.  Cardiovascular:     Rate and Rhythm: Normal rate and regular rhythm.     Pulses: Normal pulses.     Heart sounds: Heart sounds not distant. No murmur heard.    No friction rub. No gallop.     Comments: No peripheral edema Pulmonary:     Effort: Pulmonary effort is normal. No respiratory distress.     Breath sounds: Normal breath sounds.  Skin:    General: Skin is warm and dry.     Findings: No rash.  Psychiatric:        Mood and Affect: Mood is depressed. Affect is tearful.        Speech: Speech normal.        Behavior: Behavior normal.        Thought Content: Thought content normal.        Cognition and Memory: Cognition normal.        Judgment: Judgment normal.       Results for orders placed or performed during the hospital encounter of 01/04/23  Urinalysis, Routine w reflex microscopic -Urine, Clean Catch   Collection Time: 01/04/23  6:14 PM  Result Value Ref Range   Color, Urine YELLOW (A) YELLOW   APPearance HAZY (A) CLEAR   Specific Gravity, Urine 1.030 1.005 - 1.030   pH 5.0 5.0 - 8.0   Glucose, UA NEGATIVE NEGATIVE mg/dL   Hgb urine dipstick LARGE (A) NEGATIVE   Bilirubin  Urine NEGATIVE NEGATIVE  Ketones, ur 5 (A) NEGATIVE mg/dL   Protein, ur 409 (A) NEGATIVE mg/dL   Nitrite NEGATIVE NEGATIVE   Leukocytes,Ua NEGATIVE NEGATIVE   RBC / HPF 0-5 0 - 5 RBC/hpf   WBC, UA 0-5 0 - 5 WBC/hpf   Bacteria, UA RARE (A) NONE SEEN   Squamous Epithelial / HPF 0-5 0 - 5 /HPF   Mucus PRESENT   CBC   Collection Time: 01/04/23  6:14 PM  Result Value Ref Range   WBC 9.5 4.0 - 10.5 K/uL   RBC 5.48 4.22 - 5.81 MIL/uL   Hemoglobin 15.3 13.0 - 17.0 g/dL   HCT 81.1 91.4 - 78.2 %   MCV 79.7 (L) 80.0 - 100.0 fL   MCH 27.9 26.0 - 34.0 pg   MCHC 35.0 30.0 - 36.0 g/dL   RDW 95.6 21.3 - 08.6 %   Platelets 270 150 - 400 K/uL   nRBC 0.0 0.0 - 0.2 %  Basic metabolic panel   Collection Time: 01/04/23  6:14 PM  Result Value Ref Range   Sodium 136 135 - 145 mmol/L   Potassium 3.6 3.5 - 5.1 mmol/L   Chloride 106 98 - 111 mmol/L   CO2 20 (L) 22 - 32 mmol/L   Glucose, Bld 141 (H) 70 - 99 mg/dL   BUN 21 (H) 6 - 20 mg/dL   Creatinine, Ser 5.78 0.61 - 1.24 mg/dL   Calcium 9.1 8.9 - 46.9 mg/dL   GFR, Estimated >62 >95 mL/min   Anion gap 10 5 - 15     COVID 19 screen:  No recent travel or known exposure to COVID19 The patient denies respiratory symptoms of COVID 19 at this time. The importance of social distancing was discussed today.   Assessment and Plan   The patient's preventative maintenance and recommended screening tests for an annual wellness exam were reviewed in full today. Brought up to date unless services declined.  Counselled on the importance of diet, exercise, and its role in overall health and mortality. The patient's FH and SH was reviewed, including their home life, tobacco status, and drug and alcohol status.    Vaccines: Prostate Cancer Screen: Colon Cancer Screen:  Father with Colon cancer,       Smoking Status: ETOH/ drug use:  Hep C:   HIV screen:    Problem List Items Addressed This Visit     Attention deficit disorder (ADD) without  hyperactivity   Chronic, not currently taking Focalin 5 mg daily given working night shifts and in the job that he does not have issues focusing.  He will reconsider restarting this if his job situation changes.      Current mild episode of major depressive disorder without prior episode (HCC)   Worsened, chronic.  Continue current medication.  Encouraged regular exercise, referral placed for counseling.   Increase Cymbalta  to 90 mg p.o. daily he will call with update in 1 month. Trazodone 50 -100 mg p.o. at bedtime      Relevant Medications   DULoxetine (CYMBALTA) 30 MG capsule   Other Relevant Orders   Ambulatory referral to Psychology   Diarrhea    Acute, recurrent spells   Possible IBS-D... consider probiotic.  Will ev;a with labsf or secondary cause.      Relevant Orders   TSH   T4, free   T3, free   GAD (generalized anxiety disorder)   Chronic, improved control... anxiety doing well but having more depressive symptoms.  Increase Cymbalta  to 90 mg p.o. daily and trazodone 50 mg 2 tablets at night as needed for insomnia.      Relevant Medications   DULoxetine (CYMBALTA) 30 MG capsule   Other Relevant Orders   Ambulatory referral to Psychology   Obesity, Class II, BMI 35-39.9, no comorbidity   Encouraged exercise, weight loss, healthy eating habits.       Other fatigue   Other Visit Diagnoses       Routine general medical examination at a health care facility    -  Primary     Screening cholesterol level       Relevant Orders   Comprehensive metabolic panel   Lipid panel     History of bariatric surgery       Relevant Orders   CBC with Differential/Platelet   IBC + Ferritin   Vitamin B12   VITAMIN D 25 Hydroxy (Vit-D Deficiency, Fractures)       Meds ordered this encounter  Medications   DULoxetine (CYMBALTA) 30 MG capsule    Sig: Take additional 30 mg with 60 mg capsule daily    Dispense:  90 capsule    Refill:  1     Kerby Nora, MD

## 2023-10-03 NOTE — Assessment & Plan Note (Addendum)
 Chronic, improved control... anxiety doing well but having more depressive symptoms.  Increase Cymbalta  to 90 mg p.o. daily and trazodone 50 mg 2 tablets at night as needed for insomnia.

## 2023-10-03 NOTE — Assessment & Plan Note (Signed)
 Encouraged exercise, weight loss, healthy eating habits. ? ?

## 2023-10-03 NOTE — Assessment & Plan Note (Signed)
 Acute, recurrent spells   Possible IBS-D... consider probiotic.  Will ev;a with labsf or secondary cause.

## 2023-10-03 NOTE — Assessment & Plan Note (Addendum)
 Worsened, chronic.  Continue current medication.  Encouraged regular exercise, referral placed for counseling.   Increase Cymbalta  to 90 mg p.o. daily he will call with update in 1 month. Trazodone 50 -100 mg p.o. at bedtime

## 2023-10-03 NOTE — Patient Instructions (Addendum)
 Please stop at the lab to have labs drawn.  Can try probiotic for possible IBS.  Increase dose of Cymbalta to 90 mg daily. Call for counselor referral.

## 2023-10-04 ENCOUNTER — Encounter: Payer: Self-pay | Admitting: Family Medicine

## 2023-10-04 ENCOUNTER — Other Ambulatory Visit: Payer: Self-pay | Admitting: Family Medicine

## 2023-10-04 DIAGNOSIS — R7989 Other specified abnormal findings of blood chemistry: Secondary | ICD-10-CM

## 2023-10-04 MED ORDER — VITAMIN D3 1.25 MG (50000 UT) PO CAPS
1.0000 | ORAL_CAPSULE | ORAL | 0 refills | Status: DC
Start: 2023-10-04 — End: 2023-12-26

## 2023-10-25 ENCOUNTER — Other Ambulatory Visit: Payer: Self-pay | Admitting: Family Medicine

## 2023-10-28 ENCOUNTER — Encounter: Payer: Self-pay | Admitting: Family Medicine

## 2023-11-02 MED ORDER — DEXMETHYLPHENIDATE HCL ER 5 MG PO CP24: 5.0000 mg | ORAL_CAPSULE | Freq: Every day | ORAL | 0 refills | Status: DC

## 2023-11-11 ENCOUNTER — Other Ambulatory Visit: Payer: Self-pay | Admitting: Family Medicine

## 2023-11-16 ENCOUNTER — Encounter: Payer: Self-pay | Admitting: Family Medicine

## 2023-11-16 DIAGNOSIS — K529 Noninfective gastroenteritis and colitis, unspecified: Secondary | ICD-10-CM

## 2023-11-22 NOTE — Telephone Encounter (Signed)
 Referral started not sure where to send last message I received was that [facility] was not taking new patient. The only other option at this time in  [place] would be [facility].

## 2023-12-13 ENCOUNTER — Emergency Department
Admission: EM | Admit: 2023-12-13 | Discharge: 2023-12-13 | Disposition: A | Discharge status: Home / Self Care | Attending: Emergency Medicine | Admitting: Emergency Medicine

## 2023-12-13 ENCOUNTER — Emergency Department

## 2023-12-13 ENCOUNTER — Other Ambulatory Visit: Payer: Self-pay

## 2023-12-13 ENCOUNTER — Encounter: Payer: Self-pay | Admitting: Emergency Medicine

## 2023-12-13 DIAGNOSIS — K6289 Other specified diseases of anus and rectum: Secondary | ICD-10-CM | POA: Insufficient documentation

## 2023-12-13 DIAGNOSIS — M51369 Other intervertebral disc degeneration, lumbar region without mention of lumbar back pain or lower extremity pain: Secondary | ICD-10-CM

## 2023-12-13 DIAGNOSIS — R1030 Lower abdominal pain, unspecified: Secondary | ICD-10-CM | POA: Insufficient documentation

## 2023-12-13 DIAGNOSIS — K5730 Diverticulosis of large intestine without perforation or abscess without bleeding: Secondary | ICD-10-CM | POA: Diagnosis not present

## 2023-12-13 LAB — COMPREHENSIVE METABOLIC PANEL WITH GFR
ALT: 30 U/L (ref 0–44)
AST: 40 U/L (ref 15–41)
Albumin: 4.5 g/dL (ref 3.5–5.0)
Alkaline Phosphatase: 53 U/L (ref 38–126)
Anion gap: 8 (ref 5–15)
BUN: 17 mg/dL (ref 6–20)
CO2: 25 mmol/L (ref 22–32)
Calcium: 9.2 mg/dL (ref 8.9–10.3)
Chloride: 106 mmol/L (ref 98–111)
Creatinine, Ser: 0.94 mg/dL (ref 0.61–1.24)
GFR, Estimated: >60 mL/min (ref >60)
Glucose, Bld: 137 mg/dL — ABNORMAL HIGH (ref 70–99)
Potassium: 3.5 mmol/L (ref 3.5–5.1)
Sodium: 139 mmol/L (ref 135–145)
Total Bilirubin: 1 mg/dL (ref 0.0–1.2)
Total Protein: 7.2 g/dL (ref 6.5–8.1)

## 2023-12-13 LAB — CBC WITH DIFFERENTIAL/PLATELET
Abs Immature Granulocytes: 0.01 10*3/uL (ref 0.00–0.07)
Basophils Absolute: 0 10*3/uL (ref 0.0–0.1)
Basophils Relative: 0 %
Eosinophils Absolute: 0.1 10*3/uL (ref 0.0–0.5)
Eosinophils Relative: 1 %
HCT: 42.2 % (ref 39.0–52.0)
Hemoglobin: 14.2 g/dL (ref 13.0–17.0)
Immature Granulocytes: 0 %
Lymphocytes Relative: 30 %
Lymphs Abs: 2.3 10*3/uL (ref 0.7–4.0)
MCH: 27.7 pg (ref 26.0–34.0)
MCHC: 33.6 g/dL (ref 30.0–36.0)
MCV: 82.3 fL (ref 80.0–100.0)
Monocytes Absolute: 0.5 10*3/uL (ref 0.1–1.0)
Monocytes Relative: 6 %
Neutro Abs: 4.9 10*3/uL (ref 1.7–7.7)
Neutrophils Relative %: 63 %
Platelets: 255 10*3/uL (ref 150–400)
RBC: 5.13 MIL/uL (ref 4.22–5.81)
RDW: 13.2 % (ref 11.5–15.5)
WBC: 7.8 10*3/uL (ref 4.0–10.5)
nRBC: 0 % (ref 0.0–0.2)

## 2023-12-13 LAB — LIPASE, BLOOD: Lipase: 44 U/L (ref 11–51)

## 2023-12-13 LAB — URINALYSIS, ROUTINE W REFLEX MICROSCOPIC
Bilirubin Urine: NEGATIVE
Glucose, UA: NEGATIVE mg/dL
Hgb urine dipstick: NEGATIVE
Ketones, ur: NEGATIVE mg/dL
Leukocytes,Ua: NEGATIVE
Nitrite: NEGATIVE
Protein, ur: NEGATIVE mg/dL
Specific Gravity, Urine: 1.019 (ref 1.005–1.030)
pH: 6 (ref 5.0–8.0)

## 2023-12-13 MED ORDER — IOHEXOL 300 MG/ML  SOLN
100.0000 mL | Freq: Once | INTRAMUSCULAR | Status: AC | PRN
  Administered 2023-12-13: 100 mL via INTRAVENOUS

## 2023-12-13 NOTE — ED Provider Notes (Signed)
 Emergency department handoff note  Care of this patient was signed out to me at the end of the previous provider shift.  All pertinent patient information was conveyed and all questions were answered.  Patient pending CT of the abdomen and pelvis does not show any evidence of acute abnormalities however does show worsening bulging of L5/S1 intervertebral disc causing compression on bilateral S1 nerve roots.  Patient was informed of the results.  Pain well-controlled The patient has been reexamined and is ready to be discharged.  All diagnostic results have been reviewed and discussed with the patient/family.  Care plan has been outlined and the patient/family understands all current diagnoses, results, and treatment plans.  There are no new complaints, changes, or physical findings at this time.  All questions have been addressed and answered.  Patient was instructed to, and agrees to follow-up with their primary care physician as well as return to the emergency department if any new or worsening symptoms develop.   Analeise Mccleery K, MD 12/13/23 [phone]

## 2023-12-13 NOTE — ED Provider Notes (Signed)
 [facility] Provider Note    Event Date/Time   First MD Initiated Contact with Patient 12/13/23 [phone]     (approximate)   History   Abdominal Pain   HPI  Randall Barnett is a 43 y.o. male who presents to the ED from home with a chief complaint of lower abdominal pain, rectal pain and thin, stringy poops.  Patient with a history of gastric sleeve with diarrhea at baseline.  Reports 1 week ago began to have lower abdominal pain and thin stringy poops.  PCP had him do a colorectal screening card which was positive for blood.  Appointment with GI not until September of this year.  Presents because he is not able to sit on his bottom due to rectal pain which he describes from coming within.  Denies associated fever/chills, chest pain, shortness of breath, nausea, vomiting or dizziness.     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 Authorizing Provider  Cholecalciferol (VITAMIN D3) 1.25 MG (50000 UT) CAPS Take 1 capsule (1.25 mg total) by mouth once a week. 10/04/23   Bedsole, Amy E, MD  dexmethylphenidate  (FOCALIN  XR) 5 MG 24 hr  capsule Take 1 capsule (5 mg total) by mouth daily. 11/02/23   Bedsole, Amy E, MD  dexmethylphenidate  (FOCALIN  XR) 5 MG 24 hr capsule Take 1 capsule (5 mg total) by mouth daily. Fill on or after January 02, 2024 11/02/23   Judithann Novas, MD  dexmethylphenidate  (FOCALIN  XR) 5 MG 24 hr capsule Take 1 capsule (5 mg total) by mouth daily. Fill  on or after Dec 02, 2023 11/02/23   Judithann Novas, MD  DULoxetine  (CYMBALTA ) 30 MG capsule Take additional 30 mg with 60 mg capsule daily 10/03/23   Bedsole, Amy E, MD  DULoxetine  (CYMBALTA ) 60 MG capsule TAKE 1 CAPSULE BY MOUTH EVERY DAY 10/25/23   Bedsole, Amy E, MD  ondansetron  (ZOFRAN -ODT) 4 MG disintegrating tablet Take 1 tablet (4 mg total) by mouth every 8 (eight) hours as needed. 08/16/23   Angelia Kelp, PA-C  tiZANidine  (ZANAFLEX ) 4 MG tablet TAKE 1 TABLET BY MOUTH THREE TIMES A DAY 09/12/23   Bedsole, Amy E, MD  traZODone  (DESYREL ) 50 MG tablet TAKE 2 TABLETS EVERY NIGHT AS NEEDED FOR INSOMNIA 11/14/23   Judithann Novas, MD     Allergies  Vicodin hp [hydrocodone-acetaminophen]   Family History   Family History  Problem Relation Age of Onset   Cancer Paternal Uncle        lung cancer   Cancer Paternal Grandmother  pancreatic     Physical Exam  Triage Vital Signs: ED Triage Vitals  Encounter Vitals Group     BP 12/13/23 0543 (!) 152/82     Systolic BP Percentile --      Diastolic BP Percentile --      Pulse Rate 12/13/23 0543 88     Resp 12/13/23 0543 20     Temp 12/13/23 0543 98.6 F (37 C)     Temp Source 12/13/23 0543 Oral     SpO2 12/13/23 0543 99 %     Weight 12/13/23 0540 245 lb (111.1 kg)     Height 12/13/23 0540 5\' 10"  (1.778 m)     Head Circumference --      Peak Flow --      Pain Score 12/13/23 0540 5     Pain Loc --      Pain Education --      Exclude from Growth Chart --     Updated Vital Signs: BP (!) 152/82 (BP Location: Right Arm)   Pulse 88   Temp 98.6 F (37 C) (Oral)   Resp 20   Ht 5\' 10"  (1.778 m)    Wt 111.1 kg   SpO2 99%   BMI 35.15 kg/m    General: Awake, no distress.  CV:  RRR.  Good peripheral perfusion.  Resp:  Normal effort.  CTAB. Abd:  Mild tenderness to palpation lower abdomen without rebound or guarding.  No distention.  Other:  External rectal exam reveals no hemorrhoids, fissures or prolapse.   ED Results / Procedures / Treatments  Labs (all labs ordered are listed, but only abnormal results are displayed) Labs Reviewed  COMPREHENSIVE METABOLIC PANEL WITH GFR - Abnormal; Notable for the following components:      Result Value   Glucose, Bld 137 (*)    All other components within normal limits  LIPASE, BLOOD  CBC WITH DIFFERENTIAL/PLATELET  URINALYSIS, ROUTINE W REFLEX MICROSCOPIC     EKG  None   RADIOLOGY CT abdomen/pelvis is pending   Official radiology report(s): No results found.   PROCEDURES:  Critical Care performed: No  Procedures   MEDICATIONS ORDERED IN ED: Medications - No data to display   IMPRESSION / MDM / ASSESSMENT AND PLAN / ED COURSE  I reviewed the triage vital signs and the nursing notes.                             43 year old male presenting with lower abdominal pain and changes in consistency of bowel movements. Differential diagnosis includes, but is not limited to, acute appendicitis, renal colic, testicular torsion, urinary tract infection/pyelonephritis, prostatitis,  epididymitis, diverticulitis, small bowel obstruction or ileus, colitis, abdominal aortic aneurysm, gastroenteritis, hernia, etc. I personally reviewed patient's records and note a PCP office visit on 10/03/2023 for follow-up routine medical exam, GAD, ADHD, obesity, diarrhea, depression.  Patient's presentation is most consistent with acute complicated illness / injury requiring diagnostic workup.  Will obtain abdominal lab work, CT abdomen/pelvis.  No pain currently as long as patient does not sit on his rectum.  Clinical Course as of 12/13/23 0651   Wed Dec 13, 2023  0651 CMP and lipase unremarkable.  Patient pending CBC, UA and CT abdomen/pelvis.  Care be transferred to the oncoming provider at change of shift pending these results and disposition. [JS]    Clinical Course User Index [JS] Norlene Beavers, MD     FINAL CLINICAL  IMPRESSION(S) / ED DIAGNOSES   Final diagnoses:  Lower abdominal pain     Rx / DC Orders   ED Discharge Orders     None        Note:  This document was prepared using Dragon voice recognition software and may include unintentional dictation errors.   Felise Georgia J, MD 12/13/23 [phone]

## 2023-12-13 NOTE — Discharge Instructions (Addendum)
 Please use ibuprofen (Motrin) up to 800 mg every 8 hours, naproxen (Naprosyn) up to 500 mg every 12 hours, and/or acetaminophen (Tylenol) up to 4 g/day for any continued pain.  Please do not use this medication regimen for longer than 7 days

## 2023-12-13 NOTE — ED Notes (Signed)
 Introduced self to pt. Pt resting on side in bed. Pt cannot sit directly on rectum. Pt denies needs at this time. Waiting for CT results.

## 2023-12-13 NOTE — ED Notes (Signed)
 Pt provided specimen cup in order to obtain urine specimen.

## 2023-12-13 NOTE — ED Triage Notes (Signed)
 Patient ambulatory to triage with steady gait, without difficulty or distress noted; pt reports x wk having lower abd pain radiating into rectum accomp by "thin poops"

## 2023-12-25 ENCOUNTER — Other Ambulatory Visit: Payer: Self-pay | Admitting: Family Medicine

## 2023-12-26 NOTE — Telephone Encounter (Signed)
 Last office visit 10/03/2023 for CPE.  Last refilled 10/04/2023 for #12 with no refills.  Vit D level 10/03/2023  which was low at 27.26 ng/ml.  Next Appt: No future appointments.

## 2024-01-04 ENCOUNTER — Other Ambulatory Visit: Payer: Self-pay | Admitting: Family Medicine

## 2024-01-04 NOTE — Telephone Encounter (Signed)
 Last office visit 10/03/2023 for CPE.  Last refilled 09/12/23 for #90 with 2 refills.  Next Appt: No future appointments

## 2024-02-18 ENCOUNTER — Encounter: Payer: Self-pay | Admitting: Family Medicine

## 2024-02-19 MED ORDER — DEXMETHYLPHENIDATE HCL ER 5 MG PO CP24: 5.0000 mg | ORAL_CAPSULE | Freq: Every day | ORAL | 0 refills | Status: DC

## 2024-02-19 NOTE — Telephone Encounter (Signed)
 Last office visit 10/03/2023 for CPE.  Last refilled 11/02/2023 for #30 with no refills x 3 months.  Patient is out of town and without his medication.  No future appointments.

## 2024-03-04 ENCOUNTER — Other Ambulatory Visit: Payer: Self-pay | Admitting: Family Medicine

## 2024-04-04 ENCOUNTER — Encounter: Payer: Self-pay | Admitting: Family Medicine

## 2024-04-04 MED ORDER — DEXMETHYLPHENIDATE HCL ER 5 MG PO CP24: 5.0000 mg | ORAL_CAPSULE | Freq: Every day | ORAL | 0 refills | Status: DC

## 2024-04-04 NOTE — Telephone Encounter (Signed)
 Last office visit 10/03/23 for CPE.  Last refilled 02/19/2024 for #30 with no refills.  Next Appt: No future appointments.

## 2024-05-31 ENCOUNTER — Encounter: Payer: Self-pay | Admitting: Family Medicine

## 2024-06-04 ENCOUNTER — Other Ambulatory Visit: Payer: Self-pay | Admitting: Family Medicine

## 2024-06-04 MED ORDER — DEXMETHYLPHENIDATE HCL ER 5 MG PO CP24: 5.0000 mg | ORAL_CAPSULE | Freq: Every day | ORAL | 0 refills | Status: AC

## 2024-06-04 MED ORDER — DEXMETHYLPHENIDATE HCL ER 5 MG PO CP24: 5.0000 mg | ORAL_CAPSULE | Freq: Every day | ORAL | 0 refills | Status: DC

## 2024-06-04 MED ORDER — TIZANIDINE HCL 4 MG PO TABS
4.0000 mg | ORAL_TABLET | Freq: Three times a day (TID) | ORAL | 2 refills | Status: AC
Start: 2024-06-04 — End: ?

## 2024-06-04 MED ORDER — TIZANIDINE HCL 4 MG PO TABS: 4.0000 mg | ORAL_TABLET | Freq: Three times a day (TID) | ORAL | 2 refills | Status: DC

## 2024-06-07 ENCOUNTER — Ambulatory Visit: Payer: Self-pay

## 2024-06-07 NOTE — Telephone Encounter (Signed)
 FYI Only or Action Required?: FYI only for provider: pharmacy clarifying last fill date.  Patient was last seen in primary care on 10/03/2023 by Randall Greig BRAVO, MD.  Called Nurse Triage reporting Medication Problem.   Triage Disposition: Call Pharmacist Within 24 Hours  Patient/caregiver understands and will follow disposition?: Unsure          Copied from CRM #[id number]. Topic: Clinical - Medication Question >> Jun 07, 2024 12:47 PM China J wrote: Reason for CRM: Pharmacist is calling for clarification on a medication called dexmethylphenidate  (FOCALIN  XR) 5 MG 24 hr capsule. The patient is at the pharmacy now. Reason for Disposition  [1] Caller has URGENT medicine question about med that primary care doctor (or NP/PA) or specialist prescribed AND [2] triager unable to answer question    Pt currently in OR and local pharmacy attempted to clarify last fill date of RX. Triager unable to answer but provided [facility] pharmacy info for Rph to call and confirm.  Answer Assessment - Initial Assessment Questions 1. NAME of MEDICINE: What medicine(s) are you calling about?     dexmethylphenidate  (FOCALIN  XR) 5 MG 24 hr capsule 2. QUESTION: What is your question? (e.g., double dose of medicine, side effect)     [facility] Pharmacist Dagne, calling to inquire about when Rx was last filled d/t rx being transferred to OR. Triager notified Rph that he would have to contact previous pharmacy to confirm last fill date. Triager located previous Rx and provided old pharmacy information: [id number] - [place], [place] Phone: [phone]    Rph stated that he will call other pharmacy to verify last fill date.  3. PRESCRIBER: Who prescribed the medicine? Reason: if prescribed by specialist, call should be referred to that group.     PCP 4. SYMPTOMS: Do you have any symptoms? If Yes, ask: What symptoms are you having?  How bad are the symptoms (e.g., mild, moderate, severe)     N/a 5.  PREGNANCY:  Is there any chance that you are pregnant? When was your last menstrual period?     N/a  Protocols used: Medication Question Call-A-AH
